# Patient Record
Sex: Female | Born: 1975 | Race: White | Hispanic: No | Marital: Single | Smoking: Former smoker
Health system: Southern US, Community
[De-identification: ages and names within clinical notes are randomized; demographics above are authoritative.]

## PROBLEM LIST (undated history)

## (undated) DIAGNOSIS — K219 Gastro-esophageal reflux disease without esophagitis: Secondary | ICD-10-CM

## (undated) DIAGNOSIS — L409 Psoriasis, unspecified: Secondary | ICD-10-CM

## (undated) DIAGNOSIS — T7840XA Allergy, unspecified, initial encounter: Secondary | ICD-10-CM

## (undated) DIAGNOSIS — L709 Acne, unspecified: Secondary | ICD-10-CM

## (undated) HISTORY — DX: Acne, unspecified: L70.9

## (undated) HISTORY — DX: Gastro-esophageal reflux disease without esophagitis: K21.9

## (undated) HISTORY — DX: Psoriasis, unspecified: L40.9

## (undated) HISTORY — DX: Allergy, unspecified, initial encounter: T78.40XA

---

## 2006-03-17 ENCOUNTER — Ambulatory Visit: Payer: Self-pay | Admitting: Family Medicine

## 2006-05-11 ENCOUNTER — Other Ambulatory Visit: Admission: RE | Admit: 2006-05-11 | Discharge: 2006-05-11 | Payer: Self-pay | Admitting: Family Medicine

## 2006-05-11 ENCOUNTER — Encounter: Payer: Self-pay | Admitting: Family Medicine

## 2006-05-11 ENCOUNTER — Ambulatory Visit: Payer: Self-pay | Admitting: Family Medicine

## 2006-07-27 ENCOUNTER — Ambulatory Visit: Payer: Self-pay | Admitting: Family Medicine

## 2007-02-07 ENCOUNTER — Ambulatory Visit: Payer: Self-pay | Admitting: Family Medicine

## 2007-04-13 ENCOUNTER — Ambulatory Visit: Payer: Self-pay | Admitting: Family Medicine

## 2007-06-25 ENCOUNTER — Telehealth: Payer: Self-pay | Admitting: *Deleted

## 2007-07-11 ENCOUNTER — Encounter: Payer: Self-pay | Admitting: Family Medicine

## 2007-08-28 DIAGNOSIS — K219 Gastro-esophageal reflux disease without esophagitis: Secondary | ICD-10-CM | POA: Insufficient documentation

## 2007-08-28 DIAGNOSIS — J45909 Unspecified asthma, uncomplicated: Secondary | ICD-10-CM | POA: Insufficient documentation

## 2007-12-13 ENCOUNTER — Encounter: Payer: Self-pay | Admitting: Family Medicine

## 2008-02-13 ENCOUNTER — Ambulatory Visit: Payer: Self-pay | Admitting: Family Medicine

## 2008-02-13 LAB — CONVERTED CEMR LAB
Glucose, Urine, Semiquant: NEGATIVE
Protein, U semiquant: NEGATIVE
Specific Gravity, Urine: 1.015
Urobilinogen, UA: 0.2
pH: 5.5

## 2008-02-15 LAB — CONVERTED CEMR LAB
ALT: 12 units/L (ref 0–35)
Bilirubin, Direct: 0.1 mg/dL (ref 0.0–0.3)
Calcium: 9 mg/dL (ref 8.4–10.5)
Cholesterol: 144 mg/dL (ref 0–200)
Eosinophils Absolute: 0.1 10*3/uL (ref 0.0–0.6)
Eosinophils Relative: 2 % (ref 0.0–5.0)
GFR calc Af Amer: 127 mL/min
GFR calc non Af Amer: 105 mL/min
Glucose, Bld: 98 mg/dL (ref 70–99)
HDL: 50.6 mg/dL (ref >39.0)
Lymphocytes Relative: 43.6 % (ref 12.0–46.0)
MCV: 88.8 fL (ref 78.0–100.0)
Neutro Abs: 1.8 10*3/uL (ref 1.4–7.7)
Platelets: 215 10*3/uL (ref 150–400)
Sodium: 139 meq/L (ref 135–145)
TSH: 3.18 microintl units/mL (ref 0.35–5.50)
Triglycerides: 94 mg/dL (ref 0–149)
WBC: 3.9 10*3/uL — ABNORMAL LOW (ref 4.5–10.5)

## 2008-02-20 ENCOUNTER — Ambulatory Visit: Payer: Self-pay | Admitting: Family Medicine

## 2008-02-20 ENCOUNTER — Encounter: Payer: Self-pay | Admitting: Family Medicine

## 2008-02-20 ENCOUNTER — Other Ambulatory Visit: Admission: RE | Admit: 2008-02-20 | Discharge: 2008-02-20 | Payer: Self-pay | Admitting: Family Medicine

## 2008-06-25 ENCOUNTER — Ambulatory Visit: Payer: Self-pay | Admitting: Family Medicine

## 2008-09-02 ENCOUNTER — Ambulatory Visit: Payer: Self-pay | Admitting: Family Medicine

## 2008-11-05 ENCOUNTER — Telehealth: Payer: Self-pay | Admitting: *Deleted

## 2008-11-05 ENCOUNTER — Ambulatory Visit: Payer: Self-pay | Admitting: Family Medicine

## 2008-11-05 DIAGNOSIS — N390 Urinary tract infection, site not specified: Secondary | ICD-10-CM | POA: Insufficient documentation

## 2008-11-05 LAB — CONVERTED CEMR LAB
Bilirubin Urine: NEGATIVE
Ketones, urine, test strip: NEGATIVE
Specific Gravity, Urine: 1.015
pH: 8

## 2008-12-26 ENCOUNTER — Ambulatory Visit: Payer: Self-pay | Admitting: Family Medicine

## 2009-02-04 ENCOUNTER — Telehealth: Payer: Self-pay | Admitting: Family Medicine

## 2009-03-18 ENCOUNTER — Ambulatory Visit: Payer: Self-pay | Admitting: Family Medicine

## 2009-03-18 LAB — CONVERTED CEMR LAB
Bilirubin Urine: NEGATIVE
Glucose, Urine, Semiquant: NEGATIVE
pH: 7

## 2009-03-19 LAB — CONVERTED CEMR LAB
ALT: 19 units/L (ref 0–35)
Albumin: 3.7 g/dL (ref 3.5–5.2)
BUN: 6 mg/dL (ref 6–23)
Chloride: 107 meq/L (ref 96–112)
Cholesterol: 154 mg/dL (ref 0–200)
Eosinophils Relative: 2.9 % (ref 0.0–5.0)
Glucose, Bld: 83 mg/dL (ref 70–99)
HCT: 38.6 % (ref 36.0–46.0)
Hemoglobin: 13.3 g/dL (ref 12.0–15.0)
Lymphs Abs: 1.6 10*3/uL (ref 0.7–4.0)
MCV: 90.9 fL (ref 78.0–100.0)
Monocytes Absolute: 0.2 10*3/uL (ref 0.1–1.0)
Neutro Abs: 1.8 10*3/uL (ref 1.4–7.7)
Platelets: 180 10*3/uL (ref 150.0–400.0)
Potassium: 3.9 meq/L (ref 3.5–5.1)
TSH: 3.49 microintl units/mL (ref 0.35–5.50)
Total Bilirubin: 0.6 mg/dL (ref 0.3–1.2)
Total Protein: 6.3 g/dL (ref 6.0–8.3)
WBC: 3.7 10*3/uL — ABNORMAL LOW (ref 4.5–10.5)

## 2009-03-25 ENCOUNTER — Ambulatory Visit: Payer: Self-pay | Admitting: Family Medicine

## 2009-03-25 ENCOUNTER — Encounter: Payer: Self-pay | Admitting: Family Medicine

## 2009-03-25 ENCOUNTER — Other Ambulatory Visit: Admission: RE | Admit: 2009-03-25 | Discharge: 2009-03-25 | Payer: Self-pay | Admitting: Family Medicine

## 2009-03-27 ENCOUNTER — Encounter: Payer: Self-pay | Admitting: Family Medicine

## 2009-12-14 ENCOUNTER — Telehealth: Payer: Self-pay | Admitting: Family Medicine

## 2009-12-15 ENCOUNTER — Ambulatory Visit: Payer: Self-pay | Admitting: Family Medicine

## 2009-12-30 ENCOUNTER — Ambulatory Visit: Payer: Self-pay | Admitting: Family Medicine

## 2010-04-14 ENCOUNTER — Ambulatory Visit: Payer: Self-pay | Admitting: Family Medicine

## 2010-04-14 LAB — CONVERTED CEMR LAB
Blood in Urine, dipstick: NEGATIVE
Glucose, Urine, Semiquant: NEGATIVE
Nitrite: NEGATIVE
Specific Gravity, Urine: 1.02
WBC Urine, dipstick: NEGATIVE
pH: 6

## 2010-04-15 ENCOUNTER — Telehealth: Payer: Self-pay | Admitting: Family Medicine

## 2010-04-15 LAB — CONVERTED CEMR LAB
ALT: 13 units/L (ref 0–35)
Alkaline Phosphatase: 61 units/L (ref 39–117)
Basophils Absolute: 0 10*3/uL (ref 0.0–0.1)
Basophils Relative: 0.3 % (ref 0.0–3.0)
Bilirubin, Direct: 0.1 mg/dL (ref 0.0–0.3)
CO2: 26 meq/L (ref 19–32)
Chloride: 105 meq/L (ref 96–112)
Eosinophils Absolute: 0.1 10*3/uL (ref 0.0–0.7)
Glucose, Bld: 77 mg/dL (ref 70–99)
Lymphocytes Relative: 28.5 % (ref 12.0–46.0)
MCHC: 35.5 g/dL (ref 30.0–36.0)
MCV: 88.2 fL (ref 78.0–100.0)
Monocytes Absolute: 0.2 10*3/uL (ref 0.1–1.0)
Neutro Abs: 2.7 10*3/uL (ref 1.4–7.7)
Neutrophils Relative %: 64.9 % (ref 43.0–77.0)
Potassium: 4.3 meq/L (ref 3.5–5.1)
RBC: 4.27 M/uL (ref 3.87–5.11)
RDW: 12.9 % (ref 11.5–14.6)
Sodium: 139 meq/L (ref 135–145)
Total Bilirubin: 0.5 mg/dL (ref 0.3–1.2)
Total CHOL/HDL Ratio: 2
Total Protein: 6.3 g/dL (ref 6.0–8.3)

## 2010-04-21 ENCOUNTER — Ambulatory Visit: Payer: Self-pay | Admitting: Family Medicine

## 2010-04-21 ENCOUNTER — Other Ambulatory Visit: Admission: RE | Admit: 2010-04-21 | Discharge: 2010-04-21 | Payer: Self-pay | Admitting: Family Medicine

## 2010-12-28 NOTE — Progress Notes (Signed)
Summary: sda slot  Phone Note Call from Patient Call back at [phone]   Caller: Patient Call For: Nelwyn Salisbury MD Summary of Call: pt has sinus inf can I use sda for tomorrow Initial call taken by: Heron Sabins,  December 14, 2009 12:54 PM  Follow-up for Phone Call        yes Follow-up by: Nelwyn Salisbury MD,  December 14, 2009 1:48 PM  Additional Follow-up for Phone Call Additional follow up Details #1::        SDA 4.15PM 12-15-2008 Additional Follow-up by: Heron Sabins,  December 14, 2009 2:51 PM

## 2010-12-28 NOTE — Assessment & Plan Note (Signed)
Summary: COUGH, CONGESTION, FEVER // RS   Vital Signs:  Patient profile:   35 year old female Weight:      156 pounds BMI:     24.52 Temp:     98.2 degrees F oral BP sitting:   114 / 82  (left arm) Cuff size:   regular  Vitals Entered By: Alfred Levins, CMA (December 30, 2009 10:54 AM) CC: congestion, cough, st, runny nose x4 days   History of Present Illness: She was here last month for a sinusitis, and she seemed to respond well to a Zpack. She felt fine for about 2 weeks, then 3 days ago she became ill again. Now she has stuffy head, HA, ST, and is coughing up yellow sputum. No fever.    Current Medications (verified): 1)  Desoximetasone 0.05 %  Crea (Desoximetasone) .... Once Daily 2)  Ortho Tri-Cyclen (28) 0.035 Mg Tabs (Norgestimate-Ethinyl Estradiol) .Marland Kitchen.. 1 By Mouth Once Daily 3)  Proventil Hfa 108 (90 Base) Mcg/act Aers (Albuterol Sulfate) .... 2 Puffs Q 4 Hours As Needed 4)  Claritin-D 24 Hour 10-240 Mg Xr24h-Tab (Loratadine-Pseudoephedrine) .... Once Daily 5)  Minocycline Hcl 100 Mg Caps (Minocycline Hcl) .... Two Times A Day  Allergies (verified): 1)  ! * Anesthesia 2)  ! Truman Hayward  Past History:  Past Medical History: Reviewed history from 03/25/2009 and no changes required. 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    Physical Exam  General:  Well-developed,well-nourished,in no acute distress; alert,appropriate and  cooperative throughout examination Head:  Normocephalic and atraumatic without obvious abnormalities. No apparent alopecia or balding. Eyes:  No corneal or conjunctival inflammation noted. EOMI. Perrla. Funduscopic exam benign, without hemorrhages, exudates or papilledema. Vision grossly normal. Ears:  External ear exam shows no significant lesions or deformities.  Otoscopic examination reveals clear canals, tympanic membranes are intact bilaterally without bulging, retraction, inflammation or discharge. Hearing is grossly normal bilaterally. Nose:  External nasal examination shows no deformity or inflammation. Nasal mucosa are pink and moist without lesions or exudates. Mouth:  Oral mucosa and oropharynx without lesions or exudates.  Teeth in good repair. Neck:  No deformities, masses, or tenderness noted. Lungs:  Normal respiratory effort, chest expands symmetrically. Lungs are clear to auscultation, no crackles or wheezes.   Impression & Recommendations:  Problem # 1:  ACUTE SINUSITIS, UNSPECIFIED (ICD-461.9)  The following medications were removed from the medication list:    Bactrim 400-80 Mg Tabs (Sulfamethoxazole-trimethoprim) ..... Once daily as needed for acne Her updated medication list for this problem includes:    Claritin-d 24 Hour 10-240 Mg Xr24h-tab (Loratadine-pseudoephedrine) ..... Once daily    Minocycline Hcl 100 Mg Caps (Minocycline hcl) .Marland Kitchen..Marland Kitchen Two times a day as needed    Augmentin 875-125 Mg Tabs (Amoxicillin-pot clavulanate) .Marland Kitchen..Marland Kitchen Two times a day    Hydromet 5-1.5 Mg/1ml Syrp (Hydrocodone-homatropine) .Marland Kitchen... 1 tsp q 4 hours as needed cough  Complete Medication List: 1)  Desoximetasone 0.05 % Crea (Desoximetasone) .... Once daily 2)  Ortho Tri-cyclen (28) 0.035 Mg Tabs (Norgestimate-ethinyl estradiol) .Marland Kitchen.. 1 by mouth once daily 3)  Proventil Hfa 108 (90 Base)  Mcg/act Aers (Albuterol sulfate) .... 2 puffs q 4 hours as needed 4)  Claritin-d 24 Hour 10-240 Mg Xr24h-tab  (Loratadine-pseudoephedrine) .... Once daily 5)  Minocycline Hcl 100 Mg Caps (Minocycline hcl) .... Two times a day as needed 6)  Augmentin 875-125 Mg Tabs (Amoxicillin-pot clavulanate) .... Two times a day 7)  Hydromet 5-1.5 Mg/24ml Syrp (Hydrocodone-homatropine) .Marland Kitchen.. 1 tsp q 4 hours as needed cough  Patient Instructions: 1)  Out of work today.  2)  Please schedule a follow-up appointment as needed .  Prescriptions: HYDROMET 5-1.5 MG/5ML SYRP (HYDROCODONE-HOMATROPINE) 1 tsp q 4 hours as needed cough  #240 x 0   Entered and Authorized by:   Nelwyn Salisbury MD   Signed by:   Nelwyn Salisbury MD on 12/30/2009   Method used:   Print then Give to Patient   RxID:   [id number] AUGMENTIN 875-125 MG TABS (AMOXICILLIN-POT CLAVULANATE) two times a day  #20 x 0   Entered and Authorized by:   Nelwyn Salisbury MD   Signed by:   Nelwyn Salisbury MD on 12/30/2009   Method used:   Print then Give to Patient   RxID:   [id number]

## 2010-12-28 NOTE — Assessment & Plan Note (Signed)
Summary: cpx/pap/njr   Vital Signs:  Patient profile:   35 year old female Weight:      162 pounds BMI:     25.46 BP sitting:   104 / 72  (left arm) Cuff size:   regular  Vitals Entered By: Raechel Ache, RN (Apr 21, 2010 1:27 PM) CC: CPX, labs done.   History of Present Illness: 35 yr old female for a cpx. She feels great and has no concerns. Her asthma is stable, and she has used her inhaler only  twice in the past year.   Allergies: 1)  ! * Anesthesia 2)  ! Truman Hayward  Past History:  Past Medical History: Allergies Psoriasis, sees [employer] at Dr. Scharlene Gloss office Asthma GERD acne  Past Surgical History: Reviewed history from 08/28/2007 and no changes required. Denies surgical history  Family History: Reviewed history from 08/28/2007 and no changes required. Family History of Alcoholism/Addiction Family History of Arthritis Family History Diabetes 1st degree relative Family History High cholesterol Family History Hypertension Family History Psychiatric care Family History of Ulcers Family History of Cardiovascular disorder  Social History: Reviewed history from 08/28/2007 and no changes required. Occupation: Single Former Smoker Alcohol use-yes Drug use-no Regular exercise-no  Review of Systems  The patient denies anorexia, fever, weight loss, weight gain, vision loss, decreased hearing, hoarseness, chest pain, syncope, dyspnea on exertion, peripheral edema, prolonged cough, headaches, hemoptysis, abdominal pain, melena, hematochezia, severe indigestion/heartburn, hematuria, incontinence, genital sores, muscle weakness, suspicious skin lesions, transient blindness, difficulty walking, depression, unusual weight change, abnormal bleeding, enlarged lymph nodes, angioedema, breast masses, and testicular masses.    Physical Exam  General:  Well-developed,well-nourished,in no acute distress; alert,appropriate and cooperative throughout examination Head:   Normocephalic and atraumatic without obvious abnormalities. No apparent alopecia or balding. Eyes:  No corneal or conjunctival inflammation noted. EOMI. Perrla. Funduscopic exam benign, without hemorrhages, exudates or papilledema. Vision grossly normal. Ears:  External ear exam shows no significant lesions or deformities.  Otoscopic examination reveals clear canals, tympanic membranes are intact bilaterally without bulging, retraction, inflammation or discharge. Hearing is grossly normal bilaterally. Nose:  External nasal examination shows no deformity or inflammation. Nasal mucosa are pink and moist without lesions or exudates. Mouth:  Oral mucosa and oropharynx without lesions or exudates.  Teeth in good repair. Neck:  No deformities, masses, or tenderness noted. Chest Wall:  No deformities, masses, or tenderness noted. Breasts:  No mass, nodules, thickening, tenderness, bulging, retraction, inflamation, nipple discharge or skin changes noted.   Lungs:  Normal respiratory effort, chest expands symmetrically. Lungs are clear to auscultation, no crackles or wheezes. Heart:  Normal rate and regular rhythm. S1 and S2 normal without gallop, murmur, click, rub or other extra sounds. Abdomen:  Bowel sounds positive,abdomen soft and non-tender without masses, organomegaly or hernias noted. Genitalia:  Pelvic Exam:        External: normal female genitalia without lesions or masses        Vagina: normal without lesions or masses        Cervix: normal without lesions or masses        Adnexa: normal bimanual exam without masses or fullness        Uterus: normal by palpation        Pap smear: performed Msk:  No deformity or scoliosis noted of thoracic or lumbar spine.   Pulses:  R and L carotid,radial,femoral,dorsalis pedis and posterior tibial pulses are full and equal bilaterally Extremities:  No clubbing, cyanosis, edema, or  deformity noted with normal full range of motion of all joints.   Neurologic:   No cranial nerve deficits noted. Station and gait are normal. Plantar reflexes are down-going bilaterally. DTRs are symmetrical throughout. Sensory, motor and coordinative functions appear intact. Skin:  Intact without suspicious lesions or rashes Cervical Nodes:  No lymphadenopathy noted Axillary Nodes:  No palpable lymphadenopathy Inguinal Nodes:  No significant adenopathy Psych:  Cognition and judgment appear intact. Alert and cooperative with normal attention span and concentration. No apparent delusions, illusions, hallucinations   Impression & Recommendations:  Problem # 1:  PHYSICAL EXAMINATION (ICD-V70.0)  Complete Medication List: 1)  Desoximetasone 0.05 % Crea (Desoximetasone) .... Once daily 2)  Ortho Tri-cyclen (28) 0.035 Mg Tabs (Norgestimate-ethinyl estradiol) .Marland Kitchen.. 1 by mouth once daily 3)  Proventil Hfa 108 (90 Base) Mcg/act Aers (Albuterol sulfate) .... 2 puffs q 4 hours as needed 4)  Claritin-d 24 Hour 10-240 Mg Xr24h-tab (Loratadine-pseudoephedrine) .... Once daily 5)  Minocycline Hcl 100 Mg Caps (Minocycline hcl) .... Two times a day as needed  Patient Instructions: 1)  Please schedule a follow-up appointment in 1 year.  Prescriptions: PROVENTIL HFA 108 (90 BASE) MCG/ACT AERS (ALBUTEROL SULFATE) 2 puffs q 4 hours as needed  #3 x 3   Entered and Authorized by:   Nelwyn Salisbury MD   Signed by:   Nelwyn Salisbury MD on 04/21/2010   Method used:   Print then Give to Patient   RxID:   [id number] ORTHO TRI-CYCLEN (28) 0.035 MG TABS (NORGESTIMATE-ETHINYL ESTRADIOL) 1 by mouth once daily  #90 x 3   Entered and Authorized by:   Nelwyn Salisbury MD   Signed by:   Nelwyn Salisbury MD on 04/21/2010   Method used:   Print then Give to Patient   RxID:   [id number]

## 2010-12-28 NOTE — Progress Notes (Signed)
Summary: rtc  Phone Note Call from Patient Call back at [phone] ask for Mella   Caller: Patient Call For: Nelwyn Salisbury MD Summary of Call: pt is returning judi call. Initial call taken by: Heron Sabins,  Apr 15, 2010 9:03 AM  Follow-up for Phone Call        lab report given Follow-up by: Raechel Ache, RN,  Apr 15, 2010 9:15 AM

## 2010-12-28 NOTE — Assessment & Plan Note (Signed)
Summary: ?SINUS INF/OK PER DOC/NJR   Vital Signs:  Patient profile:   35 year old female Weight:      156 pounds Temp:     98.7 degrees F oral BP sitting:   122 / 82  (left arm) Cuff size:   regular  Vitals Entered By: Alfred Levins, CMA (December 15, 2009 4:08 PM) CC: sinus pressure x3 wks   History of Present Illness: Here with 3 weeks of sinus pressure, HA, PND, ST, and a dry cough. No fever.   Current Medications (verified): 1)  Clarinex Reditabs 5 Mg  Tbdp (Desloratadine) .Marland Kitchen.. 1 By Mouth Once Daily 2)  Desoximetasone 0.05 %  Crea (Desoximetasone) .... Once Daily 3)  Ortho Tri-Cyclen (28) 0.035 Mg Tabs (Norgestimate-Ethinyl Estradiol) .Marland Kitchen.. 1 By Mouth Once Daily 4)  Bactrim 400-80 Mg Tabs (Sulfamethoxazole-Trimethoprim) .... Once Daily As Needed For Acne 5)  Proventil Hfa 108 (90 Base) Mcg/act Aers (Albuterol Sulfate) .... 2 Puffs Q 4 Hours As Needed  Allergies (verified): 1)  ! * Anesthesia 2)  ! Truman Hayward  Past History:  Past Medical History: Reviewed history from 03/25/2009 and no changes required. 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    Physical Exam  General:  Well-developed,well-nourished,in no acute distress; alert,appropriate and cooperative throughout examination Head:  Normocephalic and atraumatic without obvious abnormalities. No apparent alopecia or balding. Eyes:  No corneal or conjunctival inflammation noted. EOMI.  Perrla. Funduscopic exam benign, without hemorrhages, exudates or papilledema. Vision grossly normal. Ears:  External ear exam shows no significant lesions or deformities.  Otoscopic examination reveals clear canals, tympanic membranes are intact bilaterally without bulging, retraction, inflammation or discharge. Hearing is grossly normal bilaterally. Nose:  External nasal examination shows no deformity or inflammation. Nasal mucosa are pink and moist without lesions or exudates. Mouth:  Oral mucosa and oropharynx without lesions or exudates.  Teeth in good repair. Neck:  No deformities, masses, or tenderness noted. Lungs:  Normal respiratory effort, chest expands symmetrically. Lungs are clear to auscultation, no crackles or wheezes.   Impression & Recommendations:  Problem # 1:  ACUTE SINUSITIS, UNSPECIFIED (ICD-461.9)  Her updated medication list for this problem includes:    Bactrim 400-80 Mg Tabs (Sulfamethoxazole-trimethoprim) ..... Once daily as needed for acne    Claritin-d 24 Hour 10-240 Mg Xr24h-tab (Loratadine-pseudoephedrine) ..... Once daily    Minocycline Hcl 100 Mg Caps (Minocycline hcl) .Marland Kitchen..Marland Kitchen Two times a day    Zithromax Z-pak 250 Mg Tabs (Azithromycin) .Marland Kitchen... As directed  Complete Medication List: 1)  Desoximetasone 0.05 % Crea (Desoximetasone) .... Once daily 2)  Ortho Tri-cyclen (28) 0.035 Mg Tabs (Norgestimate-ethinyl estradiol) .Marland Kitchen.. 1 by mouth once daily 3)  Bactrim 400-80 Mg Tabs (Sulfamethoxazole-trimethoprim) .... Once daily as needed for acne 4)  Proventil Hfa 108 (90 Base) Mcg/act Aers (Albuterol sulfate) .... 2 puffs q 4 hours as needed 5)  Claritin-d 24 Hour 10-240 Mg Xr24h-tab (Loratadine-pseudoephedrine) .... Once daily 6)  Minocycline Hcl 100 Mg Caps (Minocycline hcl) .... Two times a day 7)  Zithromax Z-pak 250 Mg Tabs (Azithromycin) .... As directed  Patient Instructions: 1)  Please schedule  a follow-up appointment as needed .  Prescriptions: ZITHROMAX  Z-PAK 250 MG TABS (AZITHROMYCIN) as directed  #1 x 0   Entered and Authorized by:   Nelwyn Salisbury MD   Signed by:   Nelwyn Salisbury MD on 12/15/2009   Method used:   Electronically to        [facility]       [address]       [place], [place]  [address]       Ph: [phone]       Fax: [phone]   RxID:   [id number]

## 2011-06-29 ENCOUNTER — Other Ambulatory Visit (INDEPENDENT_AMBULATORY_CARE_PROVIDER_SITE_OTHER): Payer: BC Managed Care – PPO

## 2011-06-29 DIAGNOSIS — Z0000 Encounter for general adult medical examination without abnormal findings: Secondary | ICD-10-CM

## 2011-06-29 LAB — CBC WITH DIFFERENTIAL/PLATELET
Basophils Absolute: 0 10*3/uL (ref 0.0–0.1)
Lymphocytes Relative: 35.1 % (ref 12.0–46.0)
Lymphs Abs: 1.2 10*3/uL (ref 0.7–4.0)
Monocytes Relative: 6 % (ref 3.0–12.0)
Neutrophils Relative %: 55.9 % (ref 43.0–77.0)
Platelets: 220 10*3/uL (ref 150.0–400.0)
RDW: 12.8 % (ref 11.5–14.6)

## 2011-06-29 LAB — POCT URINALYSIS DIPSTICK
Ketones, UA: NEGATIVE
Leukocytes, UA: NEGATIVE
Protein, UA: NEGATIVE
pH, UA: 6.5

## 2011-06-29 LAB — BASIC METABOLIC PANEL
BUN: 11 mg/dL (ref 6–23)
Calcium: 8.4 mg/dL (ref 8.4–10.5)
Creatinine, Ser: 0.7 mg/dL (ref 0.4–1.2)
GFR: 96.22 mL/min (ref >60.00)
Glucose, Bld: 86 mg/dL (ref 70–99)

## 2011-06-29 LAB — LIPID PANEL
Cholesterol: 138 mg/dL (ref 0–200)
HDL: 65.4 mg/dL (ref >39.00)
Triglycerides: 118 mg/dL (ref 0.0–149.0)
VLDL: 23.6 mg/dL (ref 0.0–40.0)

## 2011-06-30 ENCOUNTER — Encounter: Payer: Self-pay | Admitting: Family Medicine

## 2011-06-30 LAB — HEPATIC FUNCTION PANEL
ALT: 13 U/L (ref 0–35)
Bilirubin, Direct: 0 mg/dL (ref 0.0–0.3)
Total Protein: 6.6 g/dL (ref 6.0–8.3)

## 2011-07-05 ENCOUNTER — Other Ambulatory Visit (HOSPITAL_COMMUNITY)
Admission: RE | Admit: 2011-07-05 | Discharge: 2011-07-05 | Disposition: A | Discharge status: Home / Self Care | Payer: BC Managed Care – PPO | Source: Ambulatory Visit | Attending: Family Medicine | Admitting: Family Medicine

## 2011-07-05 ENCOUNTER — Telehealth: Payer: Self-pay | Admitting: Family Medicine

## 2011-07-05 ENCOUNTER — Encounter: Payer: Self-pay | Admitting: Family Medicine

## 2011-07-05 ENCOUNTER — Ambulatory Visit (INDEPENDENT_AMBULATORY_CARE_PROVIDER_SITE_OTHER): Payer: BC Managed Care – PPO | Admitting: Family Medicine

## 2011-07-05 VITALS — BP 110/72 | HR 104 | Temp 98.6°F | Ht 66.75 in | Wt 174.0 lb

## 2011-07-05 DIAGNOSIS — Z01419 Encounter for gynecological examination (general) (routine) without abnormal findings: Secondary | ICD-10-CM | POA: Insufficient documentation

## 2011-07-05 DIAGNOSIS — Z0000 Encounter for general adult medical examination without abnormal findings: Secondary | ICD-10-CM

## 2011-07-05 MED ORDER — OMEPRAZOLE 40 MG PO CPDR: 40.0000 mg | DELAYED_RELEASE_CAPSULE | Freq: Every day | ORAL | Status: DC

## 2011-07-05 MED ORDER — DROSPIRENONE-ETHINYL ESTRADIOL 3-0.02 MG PO TABS: 1.0000 | ORAL_TABLET | Freq: Every day | ORAL | Status: DC

## 2011-07-05 MED ORDER — ALBUTEROL SULFATE HFA 108 (90 BASE) MCG/ACT IN AERS: 2.0000 | INHALATION_SPRAY | RESPIRATORY_TRACT | Status: DC | PRN

## 2011-07-05 NOTE — Telephone Encounter (Signed)
Pt called and said that her mail order pharmacy has changed from [facility] to [facility] order. Pls send all of pts meds to [facility].

## 2011-07-05 NOTE — Telephone Encounter (Signed)
meds re-ordered to [facility]

## 2011-07-05 NOTE — Progress Notes (Signed)
Spoke with pt and gave results. 

## 2011-07-05 NOTE — Progress Notes (Signed)
Subjective:    Patient ID: Michele Carey, female    DOB: October 24, 1976, 35 y.o.   MRN: [id number]  HPI 35 yr old female for a cpx. She feels fine but has one complaint. She has been on her current BCP for around 5 years, and she thinks it may be suppressing her libido. She wants to switch. Her menses are regular. Her asthma has been stable.    Review of Systems  Constitutional: Negative.  Negative for fever, diaphoresis, activity change, appetite change, fatigue and unexpected weight change.  HENT: Negative.  Negative for hearing loss, ear pain, nosebleeds, congestion, sore throat, trouble swallowing, neck pain, neck stiffness, voice change and tinnitus.   Eyes: Negative.  Negative for photophobia, pain, discharge, redness and visual disturbance.  Respiratory: Negative.  Negative for apnea, cough, choking, chest tightness, shortness of breath, wheezing and stridor.   Cardiovascular: Negative.  Negative for chest pain, palpitations and leg swelling.  Gastrointestinal: Negative.  Negative for nausea, vomiting, abdominal pain, diarrhea, constipation, blood in stool, abdominal distention and rectal pain.  Genitourinary: Negative.  Negative for dysuria, urgency, frequency, hematuria, flank pain, vaginal bleeding, vaginal discharge, enuresis, difficulty urinating, vaginal pain and menstrual problem.  Musculoskeletal: Negative.  Negative for myalgias, back pain, joint swelling, arthralgias and gait problem.  Skin: Negative.  Negative for color change, pallor, rash and wound.  Neurological: Negative.  Negative for dizziness, tremors, seizures, syncope, speech difficulty, weakness, light-headedness, numbness and headaches.  Hematological: Negative.  Negative for adenopathy. Does not bruise/bleed easily.  Psychiatric/Behavioral: Negative.  Negative for hallucinations, behavioral problems, confusion, sleep disturbance, dysphoric mood and agitation. The patient is not nervous/anxious.        Objective:   Physical Exam  Constitutional: She appears well-developed and well-nourished. No distress.  HENT:  Head: Normocephalic and atraumatic.  Right Ear: External ear normal.  Left Ear: External ear normal.  Nose: Nose normal.  Mouth/Throat: Oropharynx is clear and moist. No oropharyngeal exudate.  Eyes: Conjunctivae and EOM are normal. Pupils are equal, round, and reactive to light. Right eye exhibits no discharge. Left eye exhibits no discharge. No scleral icterus.  Neck: Normal range of motion. Neck supple. No JVD present. No thyromegaly present.  Cardiovascular: Normal rate, regular rhythm, normal heart sounds and intact distal pulses.  Exam reveals no gallop and no friction rub.   No murmur heard. Pulmonary/Chest: Effort normal and breath sounds normal. No stridor. No respiratory distress. She has no wheezes. She has no rales. She exhibits no tenderness.  Abdominal: Soft. Normal appearance and bowel sounds are normal. She exhibits no distension, no abdominal bruit, no ascites and no mass. There is no hepatosplenomegaly. There is no tenderness. There is no rigidity, no rebound and no guarding. No hernia.  Genitourinary: Rectum normal, vagina normal and uterus normal. No breast swelling, tenderness, discharge or bleeding. Cervix exhibits no motion tenderness, no discharge and no friability. Right adnexum displays no mass, no tenderness and no fullness. Left adnexum displays no mass, no tenderness and no fullness. No erythema, tenderness or bleeding around the vagina. No vaginal discharge found.  Musculoskeletal: Normal range of motion. She exhibits no edema and no tenderness.  Lymphadenopathy:    She has no cervical adenopathy.  Neurological: She is alert. She has normal reflexes. No cranial nerve deficit. She exhibits normal muscle tone. Coordination normal.  Skin: Skin is warm and dry. No rash noted. She is not diaphoretic. No erythema. No pallor.  Psychiatric: She has a normal mood and affect. Her  behavior is  normal. Judgment and thought content normal.          Assessment & Plan:  Well exam. Switch to Yaz daily.

## 2011-07-07 NOTE — Progress Notes (Signed)
Left voice message with normal results. 

## 2011-07-12 ENCOUNTER — Encounter: Payer: Self-pay | Admitting: Family Medicine

## 2011-07-12 ENCOUNTER — Ambulatory Visit (INDEPENDENT_AMBULATORY_CARE_PROVIDER_SITE_OTHER): Payer: BC Managed Care – PPO | Admitting: Family Medicine

## 2011-07-12 VITALS — BP 122/78 | HR 90 | Temp 98.5°F | Wt 178.0 lb

## 2011-07-12 DIAGNOSIS — B029 Zoster without complications: Secondary | ICD-10-CM

## 2011-07-12 MED ORDER — VALACYCLOVIR HCL 1 G PO TABS: 2000.0000 mg | ORAL_TABLET | Freq: Three times a day (TID) | ORAL | Status: AC

## 2011-07-12 MED ORDER — PREDNISONE (PAK) 10 MG PO TABS: ORAL_TABLET | ORAL | Status: DC

## 2011-07-12 NOTE — Progress Notes (Signed)
  Subjective:    Patient ID: Michele Carey, female    DOB: 06/09/1976, 35 y.o.   MRN: [id number]  HPI Here for 2 days of a rash on the left upper chest and into the left axilla which itches and burns. It started as a red area that then developed blisters.    Review of Systems  Constitutional: Positive for fatigue.  Skin: Positive for rash.       Objective:   Physical Exam  Constitutional: She appears well-developed and well-nourished.  Skin:       The anterior left axilla and left upper chest has  an area of macular erythema with a cluster of vesicles in the middle of it          Assessment & Plan:  Recheck prn.

## 2011-07-13 ENCOUNTER — Telehealth: Payer: Self-pay | Admitting: Family Medicine

## 2011-07-13 NOTE — Telephone Encounter (Signed)
Pt called back to check on status of getting [facility] [address]. Pls call today.

## 2011-07-13 NOTE — Telephone Encounter (Signed)
Was seen yesterday for shingles. Dr fry has asked her if she would need a pain med. She said no. But today, she really needs it. [facility]

## 2011-07-14 MED ORDER — HYDROCODONE-ACETAMINOPHEN 7.5-500 MG PO TABS: 1.0000 | ORAL_TABLET | Freq: Four times a day (QID) | ORAL | Status: AC | PRN

## 2011-07-14 NOTE — Telephone Encounter (Signed)
The forgetfulness os probably from the steroids. This is common and reversible. This will go away when she stops the meds

## 2011-07-14 NOTE — Telephone Encounter (Signed)
Pt has one more question, she is taking prednisone and valtrex and she is starting to become forgetful.  Wants to know if that has anything to do with the 2 meds

## 2011-07-14 NOTE — Telephone Encounter (Signed)
Pt calls again today checking on status on previous messages. She also wanted to let Dr Clent Ridges know that her brother was also diagnosed with shingles on the same day that she was seen here. Please advise.

## 2011-07-14 NOTE — Telephone Encounter (Signed)
Call in Vicodin 7.5/500 to take q 6 hours prn pain, #60 with no rf 

## 2011-07-15 NOTE — Telephone Encounter (Signed)
Spoke with pt and gave below message.

## 2011-11-08 ENCOUNTER — Ambulatory Visit (INDEPENDENT_AMBULATORY_CARE_PROVIDER_SITE_OTHER): Payer: BC Managed Care – PPO | Admitting: Family Medicine

## 2011-11-08 DIAGNOSIS — Z23 Encounter for immunization: Secondary | ICD-10-CM

## 2011-12-14 ENCOUNTER — Ambulatory Visit (INDEPENDENT_AMBULATORY_CARE_PROVIDER_SITE_OTHER): Payer: BC Managed Care – PPO | Admitting: Family Medicine

## 2011-12-14 ENCOUNTER — Encounter: Payer: Self-pay | Admitting: Family Medicine

## 2011-12-14 VITALS — BP 108/76 | HR 96 | Temp 99.0°F | Wt 184.0 lb

## 2011-12-14 DIAGNOSIS — J329 Chronic sinusitis, unspecified: Secondary | ICD-10-CM

## 2011-12-14 MED ORDER — AZITHROMYCIN 250 MG PO TABS: ORAL_TABLET | ORAL | Status: AC

## 2011-12-14 NOTE — Progress Notes (Signed)
  Subjective:    Patient ID: Michele Carey, female    DOB: [birth year], 36 y.o.   MRN: [id number]  HPI Here for 4 weeks of stuffy head, PND, and a dry cough. No fever or HA or ST. No wheezing or SOB. She has taken her antihistamine as usual. She has used her inhaler only twice during this time.    Review of Systems  Constitutional: Negative.   HENT: Positive for congestion, postnasal drip and sinus pressure.   Eyes: Negative.   Respiratory: Positive for cough. Negative for shortness of breath and wheezing.        Objective:   Physical Exam  Constitutional: She appears well-developed and well-nourished.  HENT:  Right Ear: External ear normal.  Left Ear: External ear normal.  Nose: Nose normal.  Mouth/Throat: Oropharynx is clear and moist. No oropharyngeal exudate.  Eyes: Conjunctivae are normal.  Pulmonary/Chest: Effort normal and breath sounds normal.  Lymphadenopathy:    She has no cervical adenopathy.          Assessment & Plan:  Add Mucinex prn

## 2012-05-03 ENCOUNTER — Other Ambulatory Visit: Payer: Self-pay | Admitting: Family Medicine

## 2012-05-03 NOTE — Telephone Encounter (Signed)
Done

## 2012-05-03 NOTE — Telephone Encounter (Signed)
Can we refill this? 

## 2012-05-14 ENCOUNTER — Other Ambulatory Visit: Payer: Self-pay | Admitting: Family Medicine

## 2012-06-26 ENCOUNTER — Encounter: Payer: Self-pay | Admitting: Family Medicine

## 2012-06-26 ENCOUNTER — Ambulatory Visit (INDEPENDENT_AMBULATORY_CARE_PROVIDER_SITE_OTHER): Payer: PRIVATE HEALTH INSURANCE | Admitting: Family Medicine

## 2012-06-26 VITALS — BP 120/86 | HR 89 | Temp 98.4°F | Wt 180.0 lb

## 2012-06-26 DIAGNOSIS — L259 Unspecified contact dermatitis, unspecified cause: Secondary | ICD-10-CM

## 2012-06-26 DIAGNOSIS — L509 Urticaria, unspecified: Secondary | ICD-10-CM

## 2012-06-26 MED ORDER — METHYLPREDNISOLONE ACETATE 40 MG/ML IJ SUSP
120.0000 mg | Freq: Once | INTRAMUSCULAR | Status: AC
  Administered 2012-06-26: 120 mg via INTRAMUSCULAR

## 2012-06-26 NOTE — Progress Notes (Signed)
  Subjective:    Patient ID: Michele Carey, female    DOB: [birth year], 36 y.o.   MRN: [id number]  HPI Here for rashes. She developed some poison ivy on both arms last week after working in the yard. She has been applying calamine lotion. Then yesterday she developed itchy red rashes all over the body, including arms and legs and trunk. She has been taking benadryl on top of her usual Claritin.    Review of Systems  Constitutional: Negative.   Respiratory: Negative.   Cardiovascular: Negative.   Skin: Positive for rash.       Objective:   Physical Exam  Constitutional: She appears well-developed and well-nourished. No distress.  Cardiovascular: Normal rate, regular rhythm, normal heart sounds and intact distal pulses.   Pulmonary/Chest: Effort normal and breath sounds normal. No respiratory distress. She has no wheezes. She has no rales.  Skin:       She has papulovesicular red rashes over both arms, also a fine red maculopapular rash on the trunk          Assessment & Plan:  She has a combination of urticaria and contact dermatitis. Given a steroid shot. Continue antihistamines. Recheck prn

## 2012-09-19 ENCOUNTER — Telehealth: Payer: Self-pay | Admitting: Family Medicine

## 2012-09-19 NOTE — Telephone Encounter (Signed)
Patient called stating that her pharmacy, [facility] has been trying to get a refill of her [place]. Please assist.

## 2012-09-19 NOTE — Telephone Encounter (Signed)
Refill for one year 

## 2012-09-20 MED ORDER — DROSPIRENONE-ETHINYL ESTRADIOL 3-0.02 MG PO TABS: 1.0000 | ORAL_TABLET | Freq: Every day | ORAL | Status: DC

## 2012-09-20 NOTE — Telephone Encounter (Signed)
done

## 2012-10-01 ENCOUNTER — Telehealth: Payer: Self-pay | Admitting: Family Medicine

## 2012-10-01 NOTE — Telephone Encounter (Signed)
Patient's prior auth on the [place] was denied. It turns out that her health insurance does not cover birth control pills as a contraceptive, but only for other dx such as endometriosis, etc. Because she has to pay cash, she would like it if you could switch her to something cheap. [facility] [address]

## 2012-10-01 NOTE — Telephone Encounter (Signed)
She needs to do her own research. Ask her pharmacist what the cheapest BCP for her to buy would be and let me know

## 2012-10-02 ENCOUNTER — Telehealth: Payer: Self-pay | Admitting: Family Medicine

## 2012-10-02 MED ORDER — NORGESTIM-ETH ESTRAD TRIPHASIC 0.18/0.215/0.25 MG-35 MCG PO TABS: 1.0000 | ORAL_TABLET | Freq: Every day | ORAL | Status: DC

## 2012-10-02 NOTE — Telephone Encounter (Signed)
Call in Tri-sprintec for one year

## 2012-10-02 NOTE — Telephone Encounter (Signed)
Sent to the pharmacy by e-scribe to [facility] [address].

## 2012-10-02 NOTE — Telephone Encounter (Signed)
Pt stated she can afford trisprintec ?mg 28 day pack call into [facility] [address]

## 2012-10-02 NOTE — Telephone Encounter (Signed)
I spoke with pt and she will check with the pharmacy.

## 2013-03-04 ENCOUNTER — Encounter: Payer: Self-pay | Admitting: Family Medicine

## 2013-03-04 ENCOUNTER — Ambulatory Visit (INDEPENDENT_AMBULATORY_CARE_PROVIDER_SITE_OTHER): Payer: PRIVATE HEALTH INSURANCE | Admitting: Family Medicine

## 2013-03-04 VITALS — BP 122/80 | HR 91 | Temp 99.3°F | Wt 180.0 lb

## 2013-03-04 DIAGNOSIS — J45909 Unspecified asthma, uncomplicated: Secondary | ICD-10-CM

## 2013-03-04 DIAGNOSIS — Z9109 Other allergy status, other than to drugs and biological substances: Secondary | ICD-10-CM

## 2013-03-04 MED ORDER — MONTELUKAST SODIUM 10 MG PO TABS: 10.0000 mg | ORAL_TABLET | Freq: Every day | ORAL | Status: DC

## 2013-03-04 MED ORDER — METHYLPREDNISOLONE ACETATE 80 MG/ML IJ SUSP
120.0000 mg | Freq: Once | INTRAMUSCULAR | Status: AC
Start: 2013-03-04 — End: 2013-03-04
  Administered 2013-03-04: 120 mg via INTRAMUSCULAR

## 2013-03-04 MED ORDER — HYDROCODONE-HOMATROPINE 5-1.5 MG/5ML PO SYRP: 5.0000 mL | ORAL_SOLUTION | ORAL | Status: DC | PRN

## 2013-03-04 MED ORDER — FLUTICASONE PROPIONATE 50 MCG/ACT NA SUSP: 2.0000 | Freq: Every day | NASAL | Status: DC

## 2013-03-04 MED ORDER — ALBUTEROL SULFATE HFA 108 (90 BASE) MCG/ACT IN AERS: 2.0000 | INHALATION_SPRAY | RESPIRATORY_TRACT | Status: DC | PRN

## 2013-03-04 NOTE — Addendum Note (Signed)
Addended by: Aniceto Boss A on: 03/04/2013 05:05 PM   Modules accepted: Orders

## 2013-03-04 NOTE — Progress Notes (Signed)
  Subjective:    Patient ID: Cathyrn Deas, female    DOB: [birth year], 37 y.o.   MRN: [id number]  HPI Here for 4 days of stuffy head, watery eyes, stuffy nose, PND, hoarseness, and a dry cough. She has had some wheezing and is using her inhaler several times a day. No fever. Using Zyrtec.    Review of Systems  Constitutional: Negative.   HENT: Positive for congestion, rhinorrhea, sneezing, postnasal drip and sinus pressure.   Eyes: Positive for itching. Negative for redness and visual disturbance.  Respiratory: Positive for cough and wheezing. Negative for shortness of breath.   Cardiovascular: Negative.        Objective:   Physical Exam  Constitutional: She appears well-developed and well-nourished.  HENT:  Right Ear: External ear normal.  Left Ear: External ear normal.  Nose: Nose normal.  Mouth/Throat: Oropharynx is clear and moist. No oropharyngeal exudate.  Eyes: Conjunctivae are normal.  Neck: Neck supple.  Pulmonary/Chest: Effort normal and breath sounds normal.  Lymphadenopathy:    She has no cervical adenopathy.          Assessment & Plan:  All these are symptoms of allergies. Given a steroid shot, add Singulair and Flonase daily.

## 2013-03-22 ENCOUNTER — Telehealth: Payer: Self-pay | Admitting: Family Medicine

## 2013-03-22 NOTE — Telephone Encounter (Signed)
Per Dr. Clent Ridges, have pt d/c Singulair.  Pt aware and verbalized understanding. Pt will call the office if hives persist or worsen or if they go away and she needs something in the place of the Singulair

## 2013-03-22 NOTE — Telephone Encounter (Signed)
Patient Information:  Caller Name: Deb  Phone: [phone]  Patient: Michele Carey, Michele Carey  Gender: Female  DOB: 12/01/1975  Age: 37 Years  PCP: Gershon Crane [facility])  Pregnant: No  Office Follow Up:  Does the office need to follow up with this patient?: Yes  Instructions For The Office: Requesting callback- has hives on one arm and elbow- concerned about reaction to Singulair   Symptoms  Reason For Call & Symptoms: Dillon states she was seen in office on 03/04/13 due to allergies. Received steroid injection and was ordered Singulair, Flonase, Hydromet,Albuterol inhaler, Prednisone Injection..  States Singulair has been very  effective.  Had onset of rash red, patches that are  itchy on  one arm and elbow onset 03/14/13. States she read that Singulair can cause hives. Asking should she discontinue the Singulair?  Requesting callback. at [phone]  Reviewed Health History In EMR: Yes  Reviewed Medications In EMR: Yes  Reviewed Allergies In EMR: Yes  Reviewed Surgeries / Procedures: Yes  Date of Onset of Symptoms: 03/15/2013  Treatments Tried: Benadryl spray  Treatments Tried Worked: Yes OB / GYN:  LMP: 03/01/2013  Guideline(s) Used:  Hives  Disposition Per Guideline:   See Today or Tomorrow in Office  Reason For Disposition Reached:   Hives persist > 1 week  Advice Given:  N/A  Patient Refused Recommendation:  Patient Refused Care Advice  Requesting callback regarding hives-

## 2013-07-15 ENCOUNTER — Telehealth: Payer: Self-pay | Admitting: Family Medicine

## 2013-07-15 MED ORDER — OMEPRAZOLE 40 MG PO CPDR: DELAYED_RELEASE_CAPSULE | ORAL | Status: DC

## 2013-07-15 NOTE — Telephone Encounter (Signed)
Pt needs refill on omeprazole (PRILOSEC) 40 MG capsule [facility]  Please send to [facility] on [address].

## 2013-08-06 ENCOUNTER — Other Ambulatory Visit (INDEPENDENT_AMBULATORY_CARE_PROVIDER_SITE_OTHER): Payer: PRIVATE HEALTH INSURANCE

## 2013-08-06 DIAGNOSIS — Z0000 Encounter for general adult medical examination without abnormal findings: Secondary | ICD-10-CM

## 2013-08-06 LAB — BASIC METABOLIC PANEL
BUN: 10 mg/dL (ref 6–23)
CO2: 24 mEq/L (ref 19–32)
Calcium: 8.5 mg/dL (ref 8.4–10.5)
Creatinine, Ser: 0.7 mg/dL (ref 0.4–1.2)
GFR: 108.42 mL/min (ref >60.00)
Glucose, Bld: 93 mg/dL (ref 70–99)

## 2013-08-06 LAB — CBC WITH DIFFERENTIAL/PLATELET
Basophils Absolute: 0 10*3/uL (ref 0.0–0.1)
Eosinophils Absolute: 0.1 10*3/uL (ref 0.0–0.7)
Lymphocytes Relative: 25.1 % (ref 12.0–46.0)
MCHC: 34.4 g/dL (ref 30.0–36.0)
Monocytes Relative: 4.7 % (ref 3.0–12.0)
Neutro Abs: 3.6 10*3/uL (ref 1.4–7.7)
Neutrophils Relative %: 68.3 % (ref 43.0–77.0)
Platelets: 258 10*3/uL (ref 150.0–400.0)
RDW: 12.5 % (ref 11.5–14.6)

## 2013-08-06 LAB — LIPID PANEL
Cholesterol: 148 mg/dL (ref 0–200)
HDL: 63 mg/dL (ref >39.00)
LDL Cholesterol: 62 mg/dL (ref 0–99)
Triglycerides: 113 mg/dL (ref 0.0–149.0)

## 2013-08-06 LAB — HEPATIC FUNCTION PANEL
AST: 17 U/L (ref 0–37)
Albumin: 3.5 g/dL (ref 3.5–5.2)
Alkaline Phosphatase: 73 U/L (ref 39–117)
Bilirubin, Direct: 0.1 mg/dL (ref 0.0–0.3)
Total Bilirubin: 0.5 mg/dL (ref 0.3–1.2)

## 2013-08-06 LAB — POCT URINALYSIS DIPSTICK
Bilirubin, UA: NEGATIVE
Glucose, UA: NEGATIVE
Spec Grav, UA: 1.01
Urobilinogen, UA: 0.2

## 2013-08-14 ENCOUNTER — Encounter: Payer: Self-pay | Admitting: Family Medicine

## 2013-08-14 ENCOUNTER — Encounter: Payer: PRIVATE HEALTH INSURANCE | Admitting: Family Medicine

## 2013-08-14 ENCOUNTER — Ambulatory Visit (INDEPENDENT_AMBULATORY_CARE_PROVIDER_SITE_OTHER): Payer: PRIVATE HEALTH INSURANCE | Admitting: Family Medicine

## 2013-08-14 VITALS — BP 120/82 | HR 85 | Temp 98.4°F | Ht 67.0 in | Wt 177.0 lb

## 2013-08-14 DIAGNOSIS — Z23 Encounter for immunization: Secondary | ICD-10-CM

## 2013-08-14 DIAGNOSIS — Z0000 Encounter for general adult medical examination without abnormal findings: Secondary | ICD-10-CM

## 2013-08-14 MED ORDER — NORGESTIM-ETH ESTRAD TRIPHASIC 0.18/0.215/0.25 MG-35 MCG PO TABS: 1.0000 | ORAL_TABLET | Freq: Every day | ORAL | Status: DC

## 2013-08-14 MED ORDER — DOXYCYCLINE HYCLATE 100 MG PO CAPS: 100.0000 mg | ORAL_CAPSULE | Freq: Two times a day (BID) | ORAL | Status: AC

## 2013-08-14 MED ORDER — OMEPRAZOLE 40 MG PO CPDR: DELAYED_RELEASE_CAPSULE | ORAL | Status: DC

## 2013-08-14 NOTE — Progress Notes (Signed)
  Subjective:    Patient ID: Michele Carey, female    DOB: 1976-09-22, 37 y.o.   MRN: [id number]  HPI 37 yr old female for a cpx. She feels well. She had been prescribed Doxycycline by her Dermatologist in the past for acne, and she asks if I could do that. She also asks about some bruising on the insteps of both feet. She started running for exercise about a month ago, and these areas are mildly painful. She wears an old pair of running shoes.    Review of Systems  Constitutional: Negative.   HENT: Negative.   Eyes: Negative.   Respiratory: Negative.   Cardiovascular: Negative.   Gastrointestinal: Negative.   Genitourinary: Negative for dysuria, urgency, frequency, hematuria, flank pain, decreased urine volume, enuresis, difficulty urinating, pelvic pain and dyspareunia.  Musculoskeletal: Negative.   Skin: Negative.   Neurological: Negative.   Psychiatric/Behavioral: Negative.        Objective:   Physical Exam  Constitutional: She is oriented to person, place, and time. She appears well-developed and well-nourished. No distress.  HENT:  Head: Normocephalic and atraumatic.  Right Ear: External ear normal.  Left Ear: External ear normal.  Nose: Nose normal.  Mouth/Throat: Oropharynx is clear and moist. No oropharyngeal exudate.  Eyes: Conjunctivae and EOM are normal. Pupils are equal, round, and reactive to light. No scleral icterus.  Neck: Normal range of motion. Neck supple. No JVD present. No thyromegaly present.  Cardiovascular: Normal rate, regular rhythm, normal heart sounds and intact distal pulses.  Exam reveals no gallop and no friction rub.   No murmur heard. Pulmonary/Chest: Effort normal and breath sounds normal. No respiratory distress. She has no wheezes. She has no rales. She exhibits no tenderness.  Abdominal: Soft. Bowel sounds are normal. She exhibits no distension and no mass. There is no tenderness. There is no rebound and no guarding.  Musculoskeletal: Normal  range of motion. She exhibits no edema and no tenderness.  Mild bruising and tenderness along both insteps  Lymphadenopathy:    She has no cervical adenopathy.  Neurological: She is alert and oriented to person, place, and time. She has normal reflexes. No cranial nerve deficit. She exhibits normal muscle tone. Coordination normal.  Skin: Skin is warm and dry. No rash noted. No erythema.  Mild papular acne on the face  Psychiatric: She has a normal mood and affect. Her behavior is normal. Judgment and thought content normal.          Assessment & Plan:  Well exam. Start on Doxycycline for the acne. I suggested she purchase a new pair of running shoes at a specialty store where she can be fitted properly.

## 2014-03-04 ENCOUNTER — Other Ambulatory Visit: Payer: Self-pay | Admitting: Family Medicine

## 2014-04-08 ENCOUNTER — Other Ambulatory Visit: Payer: Self-pay | Admitting: Family Medicine

## 2014-09-01 ENCOUNTER — Other Ambulatory Visit: Payer: Self-pay | Admitting: Family Medicine

## 2014-10-04 ENCOUNTER — Other Ambulatory Visit: Payer: Self-pay | Admitting: Family Medicine

## 2014-10-27 ENCOUNTER — Other Ambulatory Visit (INDEPENDENT_AMBULATORY_CARE_PROVIDER_SITE_OTHER): Payer: PRIVATE HEALTH INSURANCE

## 2014-10-27 DIAGNOSIS — Z0000 Encounter for general adult medical examination without abnormal findings: Secondary | ICD-10-CM

## 2014-10-27 LAB — HEPATIC FUNCTION PANEL
ALK PHOS: 88 U/L (ref 39–117)
ALT: 14 U/L (ref 0–35)
AST: 18 U/L (ref 0–37)
Albumin: 3.8 g/dL (ref 3.5–5.2)
BILIRUBIN TOTAL: 0.4 mg/dL (ref 0.2–1.2)
Bilirubin, Direct: 0 mg/dL (ref 0.0–0.3)
Total Protein: 6.5 g/dL (ref 6.0–8.3)

## 2014-10-27 LAB — BASIC METABOLIC PANEL
BUN: 11 mg/dL (ref 6–23)
CALCIUM: 8.4 mg/dL (ref 8.4–10.5)
CO2: 24 mEq/L (ref 19–32)
Chloride: 102 mEq/L (ref 96–112)
Creatinine, Ser: 0.6 mg/dL (ref 0.4–1.2)
GFR: 122.53 mL/min (ref >60.00)
Glucose, Bld: 106 mg/dL — ABNORMAL HIGH (ref 70–99)
POTASSIUM: 4 meq/L (ref 3.5–5.1)
SODIUM: 132 meq/L — AB (ref 135–145)

## 2014-10-27 LAB — LIPID PANEL
Cholesterol: 171 mg/dL (ref 0–200)
HDL: 84.3 mg/dL (ref >39.00)
LDL Cholesterol: 73 mg/dL (ref 0–99)
NONHDL: 86.7
Total CHOL/HDL Ratio: 2
Triglycerides: 71 mg/dL (ref 0.0–149.0)
VLDL: 14.2 mg/dL (ref 0.0–40.0)

## 2014-10-27 LAB — CBC WITH DIFFERENTIAL/PLATELET
Basophils Absolute: 0 10*3/uL (ref 0.0–0.1)
Basophils Relative: 0.3 % (ref 0.0–3.0)
EOS PCT: 2.3 % (ref 0.0–5.0)
Eosinophils Absolute: 0.1 10*3/uL (ref 0.0–0.7)
HEMATOCRIT: 40.3 % (ref 36.0–46.0)
HEMOGLOBIN: 13.4 g/dL (ref 12.0–15.0)
LYMPHS ABS: 1.5 10*3/uL (ref 0.7–4.0)
LYMPHS PCT: 32.6 % (ref 12.0–46.0)
MCHC: 33.1 g/dL (ref 30.0–36.0)
MCV: 87.4 fl (ref 78.0–100.0)
MONOS PCT: 5.9 % (ref 3.0–12.0)
Monocytes Absolute: 0.3 10*3/uL (ref 0.1–1.0)
Neutro Abs: 2.7 10*3/uL (ref 1.4–7.7)
Neutrophils Relative %: 58.9 % (ref 43.0–77.0)
PLATELETS: 229 10*3/uL (ref 150.0–400.0)
RBC: 4.61 Mil/uL (ref 3.87–5.11)
RDW: 13.3 % (ref 11.5–15.5)
WBC: 4.7 10*3/uL (ref 4.0–10.5)

## 2014-10-27 LAB — POCT URINALYSIS DIPSTICK
BILIRUBIN UA: NEGATIVE
Glucose, UA: NEGATIVE
KETONES UA: NEGATIVE
LEUKOCYTES UA: NEGATIVE
Nitrite, UA: NEGATIVE
PH UA: 7
Protein, UA: NEGATIVE
RBC UA: NEGATIVE
Spec Grav, UA: <=1.005
Urobilinogen, UA: 0.2

## 2014-10-27 LAB — TSH: TSH: 2.88 u[IU]/mL (ref 0.35–4.50)

## 2014-10-31 ENCOUNTER — Telehealth: Payer: Self-pay | Admitting: Family Medicine

## 2014-10-31 NOTE — Telephone Encounter (Signed)
Yes please

## 2014-10-31 NOTE — Telephone Encounter (Signed)
Pt had to cancelled appt that was sch for Monday due to starting her period. Can I create 30 min slot before end of yr?

## 2014-11-03 ENCOUNTER — Encounter: Payer: PRIVATE HEALTH INSURANCE | Admitting: Family Medicine

## 2014-11-03 NOTE — Telephone Encounter (Signed)
Pt has been scheduled.  °

## 2014-11-07 ENCOUNTER — Ambulatory Visit (INDEPENDENT_AMBULATORY_CARE_PROVIDER_SITE_OTHER): Payer: PRIVATE HEALTH INSURANCE | Admitting: Family Medicine

## 2014-11-07 ENCOUNTER — Encounter: Payer: Self-pay | Admitting: Family Medicine

## 2014-11-07 ENCOUNTER — Other Ambulatory Visit (HOSPITAL_COMMUNITY)
Admission: RE | Admit: 2014-11-07 | Discharge: 2014-11-07 | Disposition: A | Discharge status: Home / Self Care | Payer: PRIVATE HEALTH INSURANCE | Source: Ambulatory Visit | Attending: Family Medicine | Admitting: Family Medicine

## 2014-11-07 VITALS — BP 106/73 | HR 73 | Temp 98.9°F | Ht 67.0 in | Wt 164.0 lb

## 2014-11-07 DIAGNOSIS — Z0000 Encounter for general adult medical examination without abnormal findings: Secondary | ICD-10-CM

## 2014-11-07 DIAGNOSIS — Z01419 Encounter for gynecological examination (general) (routine) without abnormal findings: Secondary | ICD-10-CM | POA: Insufficient documentation

## 2014-11-07 MED ORDER — OMEPRAZOLE 40 MG PO CPDR: 40.0000 mg | DELAYED_RELEASE_CAPSULE | Freq: Every day | ORAL | Status: DC

## 2014-11-07 MED ORDER — ETONOGESTREL-ETHINYL ESTRADIOL 0.12-0.015 MG/24HR VA RING: VAGINAL_RING | VAGINAL | Status: DC

## 2014-11-07 MED ORDER — DOXYCYCLINE HYCLATE 100 MG PO CAPS: 100.0000 mg | ORAL_CAPSULE | Freq: Two times a day (BID) | ORAL | Status: AC

## 2014-11-07 MED ORDER — ALBUTEROL SULFATE HFA 108 (90 BASE) MCG/ACT IN AERS: 2.0000 | INHALATION_SPRAY | RESPIRATORY_TRACT | Status: DC | PRN

## 2014-11-07 NOTE — Addendum Note (Signed)
Addended by: Aniceto BossNIMMONS, Arelene Moroni A on: 11/07/2014 04:47 PM   Modules accepted: Orders

## 2014-11-07 NOTE — Progress Notes (Signed)
Subjective:    Patient ID: Michele AbuCarrie Carey, female    DOB: 08/17/1976, 38 y.o.   MRN: [id number]  HPI 38 yr old female for a cpx. She feels fine but she wants to switch from oral BCP to the Nuvaring. Her acne is acting up so she wants to resume taking Doxycycline.    Review of Systems  Constitutional: Negative.  Negative for fever, diaphoresis, activity change, appetite change, fatigue and unexpected weight change.  HENT: Negative.  Negative for congestion, ear pain, hearing loss, nosebleeds, sore throat, tinnitus, trouble swallowing and voice change.   Eyes: Negative.  Negative for photophobia, pain, discharge, redness and visual disturbance.  Respiratory: Negative.  Negative for apnea, cough, choking, chest tightness, shortness of breath, wheezing and stridor.   Cardiovascular: Negative.  Negative for chest pain, palpitations and leg swelling.  Gastrointestinal: Negative.  Negative for nausea, vomiting, abdominal pain, diarrhea, constipation, blood in stool, abdominal distention and rectal pain.  Genitourinary: Negative.  Negative for dysuria, urgency, frequency, hematuria, flank pain, vaginal bleeding, vaginal discharge, enuresis, difficulty urinating, vaginal pain and menstrual problem.  Musculoskeletal: Negative.  Negative for myalgias, back pain, joint swelling, arthralgias, gait problem, neck pain and neck stiffness.  Skin: Negative.  Negative for color change, pallor, rash and wound.  Neurological: Negative.  Negative for dizziness, tremors, seizures, syncope, speech difficulty, weakness, light-headedness, numbness and headaches.  Hematological: Negative for adenopathy. Does not bruise/bleed easily.  Psychiatric/Behavioral: Negative.  Negative for hallucinations, behavioral problems, confusion, sleep disturbance, dysphoric mood and agitation. The patient is not nervous/anxious.        Objective:   Physical Exam  Constitutional: She appears well-developed and well-nourished. No  distress.  HENT:  Head: Normocephalic and atraumatic.  Right Ear: External ear normal.  Left Ear: External ear normal.  Nose: Nose normal.  Mouth/Throat: Oropharynx is clear and moist. No oropharyngeal exudate.  Eyes: Conjunctivae and EOM are normal. Pupils are equal, round, and reactive to light. Right eye exhibits no discharge. Left eye exhibits no discharge. No scleral icterus.  Neck: Normal range of motion. Neck supple. No JVD present. No thyromegaly present.  Cardiovascular: Normal rate, regular rhythm, normal heart sounds and intact distal pulses.  Exam reveals no gallop and no friction rub.   No murmur heard. Pulmonary/Chest: Effort normal and breath sounds normal. No stridor. No respiratory distress. She has no wheezes. She has no rales. She exhibits no tenderness.  Abdominal: Soft. Normal appearance and bowel sounds are normal. She exhibits no distension, no abdominal bruit, no ascites and no mass. There is no hepatosplenomegaly. There is no tenderness. There is no rigidity, no rebound and no guarding. No hernia.  Genitourinary: Rectum normal, vagina normal and uterus normal. No breast swelling, tenderness, discharge or bleeding. Cervix exhibits no motion tenderness, no discharge and no friability. Right adnexum displays no mass, no tenderness and no fullness. Left adnexum displays no mass, no tenderness and no fullness. No erythema, tenderness or bleeding in the vagina. No vaginal discharge found.  Musculoskeletal: Normal range of motion. She exhibits no edema or tenderness.  Lymphadenopathy:    She has no cervical adenopathy.  Neurological: She is alert. She has normal reflexes. No cranial nerve deficit. She exhibits normal muscle tone. Coordination normal.  Skin: Skin is warm and dry. No rash noted. She is not diaphoretic. No erythema. No pallor.  Psychiatric: She has a normal mood and affect. Her behavior is normal. Judgment and thought content normal.          Assessment &  Plan:  Well exam. Advised her to get a baseline mammogram.

## 2014-11-07 NOTE — Progress Notes (Signed)
Pre visit review using our clinic review tool, if applicable. No additional management support is needed unless otherwise documented below in the visit note. 

## 2014-11-11 LAB — CYTOLOGY - PAP

## 2014-11-12 ENCOUNTER — Telehealth: Payer: Self-pay | Admitting: Family Medicine

## 2014-11-12 NOTE — Telephone Encounter (Signed)
Then ask Lyla SonCarrie what she wants to do

## 2014-11-12 NOTE — Telephone Encounter (Signed)
[employer] denied Nuva Ring.  Patient's plan provides coverage of the prescribed medication when it is being used for a medically necessary non-contraceptive purpose listed in a pharmaceutical compendium.

## 2014-11-12 NOTE — Telephone Encounter (Signed)
I spoke with pt and she will resubmit this request in January, her insurance will cover then.

## 2015-04-02 ENCOUNTER — Other Ambulatory Visit: Payer: Self-pay | Admitting: Family Medicine

## 2015-04-26 ENCOUNTER — Other Ambulatory Visit: Payer: Self-pay | Admitting: Family Medicine

## 2015-07-29 ENCOUNTER — Other Ambulatory Visit: Payer: Self-pay | Admitting: Family Medicine

## 2015-08-28 ENCOUNTER — Other Ambulatory Visit: Payer: Self-pay | Admitting: Family Medicine

## 2015-10-26 ENCOUNTER — Other Ambulatory Visit: Payer: Self-pay | Admitting: Family Medicine

## 2015-10-28 ENCOUNTER — Other Ambulatory Visit: Payer: Self-pay

## 2015-10-28 MED ORDER — OMEPRAZOLE 40 MG PO CPDR: 40.0000 mg | DELAYED_RELEASE_CAPSULE | Freq: Every day | ORAL | Status: DC

## 2015-10-28 NOTE — Telephone Encounter (Signed)
Rx request for omeprazole dr 40 mg capsule-Take 1 capsule by mouth one time daily #30.  Pharmacy:  [facility] [address]   Rx sent to pharmacy for 90 day supply; advised time for physical exam.

## 2016-02-14 ENCOUNTER — Other Ambulatory Visit: Payer: Self-pay | Admitting: Family Medicine

## 2016-02-25 ENCOUNTER — Other Ambulatory Visit (INDEPENDENT_AMBULATORY_CARE_PROVIDER_SITE_OTHER): Payer: PRIVATE HEALTH INSURANCE

## 2016-02-25 DIAGNOSIS — Z0000 Encounter for general adult medical examination without abnormal findings: Secondary | ICD-10-CM | POA: Diagnosis not present

## 2016-02-25 LAB — POC URINALSYSI DIPSTICK (AUTOMATED)
BILIRUBIN UA: NEGATIVE
GLUCOSE UA: NEGATIVE
Ketones, UA: NEGATIVE
Leukocytes, UA: NEGATIVE
Nitrite, UA: NEGATIVE
Protein, UA: NEGATIVE
RBC UA: NEGATIVE
SPEC GRAV UA: 1.01
UROBILINOGEN UA: 0.2
pH, UA: 7

## 2016-02-25 LAB — BASIC METABOLIC PANEL
BUN: 10 mg/dL (ref 6–23)
CALCIUM: 8.8 mg/dL (ref 8.4–10.5)
CHLORIDE: 104 meq/L (ref 96–112)
CO2: 27 meq/L (ref 19–32)
Creatinine, Ser: 0.68 mg/dL (ref 0.40–1.20)
GFR: 103.25 mL/min (ref >60.00)
GLUCOSE: 91 mg/dL (ref 70–99)
POTASSIUM: 3.9 meq/L (ref 3.5–5.1)
SODIUM: 138 meq/L (ref 135–145)

## 2016-02-25 LAB — CBC WITH DIFFERENTIAL/PLATELET
BASOS ABS: 0 10*3/uL (ref 0.0–0.1)
Basophils Relative: 0.5 % (ref 0.0–3.0)
EOS PCT: 1.8 % (ref 0.0–5.0)
Eosinophils Absolute: 0.1 10*3/uL (ref 0.0–0.7)
HEMATOCRIT: 38.7 % (ref 36.0–46.0)
Hemoglobin: 13.3 g/dL (ref 12.0–15.0)
LYMPHS PCT: 33.3 % (ref 12.0–46.0)
Lymphs Abs: 1.1 10*3/uL (ref 0.7–4.0)
MCHC: 34.4 g/dL (ref 30.0–36.0)
MCV: 86.4 fl (ref 78.0–100.0)
MONOS PCT: 5.7 % (ref 3.0–12.0)
Monocytes Absolute: 0.2 10*3/uL (ref 0.1–1.0)
NEUTROS ABS: 1.9 10*3/uL (ref 1.4–7.7)
Neutrophils Relative %: 58.7 % (ref 43.0–77.0)
PLATELETS: 226 10*3/uL (ref 150.0–400.0)
RBC: 4.48 Mil/uL (ref 3.87–5.11)
RDW: 12.7 % (ref 11.5–15.5)
WBC: 3.3 10*3/uL — ABNORMAL LOW (ref 4.0–10.5)

## 2016-02-25 LAB — HEPATIC FUNCTION PANEL
ALBUMIN: 4.1 g/dL (ref 3.5–5.2)
ALK PHOS: 78 U/L (ref 39–117)
ALT: 13 U/L (ref 0–35)
AST: 14 U/L (ref 0–37)
Bilirubin, Direct: 0.1 mg/dL (ref 0.0–0.3)
TOTAL PROTEIN: 6.3 g/dL (ref 6.0–8.3)
Total Bilirubin: 0.3 mg/dL (ref 0.2–1.2)

## 2016-02-25 LAB — LIPID PANEL
CHOL/HDL RATIO: 2
Cholesterol: 140 mg/dL (ref 0–200)
HDL: 72.4 mg/dL (ref >39.00)
LDL CALC: 59 mg/dL (ref 0–99)
NonHDL: 67.99
Triglycerides: 46 mg/dL (ref 0.0–149.0)
VLDL: 9.2 mg/dL (ref 0.0–40.0)

## 2016-02-25 LAB — TSH: TSH: 1.83 u[IU]/mL (ref 0.35–4.50)

## 2016-03-01 ENCOUNTER — Encounter: Payer: Self-pay | Admitting: Family Medicine

## 2016-03-01 ENCOUNTER — Ambulatory Visit (INDEPENDENT_AMBULATORY_CARE_PROVIDER_SITE_OTHER): Payer: PRIVATE HEALTH INSURANCE | Admitting: Family Medicine

## 2016-03-01 ENCOUNTER — Other Ambulatory Visit (HOSPITAL_COMMUNITY)
Admission: RE | Admit: 2016-03-01 | Discharge: 2016-03-01 | Disposition: A | Discharge status: Home / Self Care | Payer: PRIVATE HEALTH INSURANCE | Source: Ambulatory Visit | Attending: Family Medicine | Admitting: Family Medicine

## 2016-03-01 VITALS — BP 120/86 | HR 101 | Temp 99.1°F | Ht 67.0 in | Wt 174.0 lb

## 2016-03-01 DIAGNOSIS — Z0000 Encounter for general adult medical examination without abnormal findings: Secondary | ICD-10-CM | POA: Diagnosis not present

## 2016-03-01 DIAGNOSIS — Z01419 Encounter for gynecological examination (general) (routine) without abnormal findings: Secondary | ICD-10-CM | POA: Diagnosis present

## 2016-03-01 NOTE — Addendum Note (Signed)
Addended by: Aniceto BossNIMMONS, Raiyan Dalesandro A on: 03/01/2016 03:06 PM   Modules accepted: Orders

## 2016-03-01 NOTE — Progress Notes (Signed)
Subjective:    Patient ID: Michele Carey, female    DOB: [birth year], 40 y.o.   MRN: [id number]  HPI 40 yr old female for a cps. She feels well although the springtime allergies bother her sinuses. Her asthma has been well controlled and she uses her albuterol only once every week or two.    Review of Systems  Constitutional: Negative.  Negative for fever, diaphoresis, activity change, appetite change, fatigue and unexpected weight change.  HENT: Negative.  Negative for congestion, ear pain, hearing loss, nosebleeds, sore throat, tinnitus, trouble swallowing and voice change.   Eyes: Negative.  Negative for photophobia, pain, discharge, redness and visual disturbance.  Respiratory: Negative.  Negative for apnea, cough, choking, chest tightness, shortness of breath, wheezing and stridor.   Cardiovascular: Negative.  Negative for chest pain, palpitations and leg swelling.  Gastrointestinal: Negative.  Negative for nausea, vomiting, abdominal pain, diarrhea, constipation, blood in stool, abdominal distention and rectal pain.  Genitourinary: Negative.  Negative for dysuria, urgency, frequency, hematuria, flank pain, vaginal bleeding, vaginal discharge, enuresis, difficulty urinating, vaginal pain and menstrual problem.  Musculoskeletal: Negative.  Negative for myalgias, back pain, joint swelling, arthralgias, gait problem, neck pain and neck stiffness.  Skin: Negative.  Negative for color change, pallor, rash and wound.  Neurological: Negative.  Negative for dizziness, tremors, seizures, syncope, speech difficulty, weakness, light-headedness, numbness and headaches.  Hematological: Negative for adenopathy. Does not bruise/bleed easily.  Psychiatric/Behavioral: Negative.  Negative for hallucinations, behavioral problems, confusion, sleep disturbance, dysphoric mood and agitation. The patient is not nervous/anxious.        Objective:   Physical Exam  Constitutional: She appears well-developed  and well-nourished. No distress.  HENT:  Head: Normocephalic and atraumatic.  Right Ear: External ear normal.  Left Ear: External ear normal.  Nose: Nose normal.  Mouth/Throat: Oropharynx is clear and moist. No oropharyngeal exudate.  Eyes: Conjunctivae and EOM are normal. Pupils are equal, round, and reactive to light. Right eye exhibits no discharge. Left eye exhibits no discharge. No scleral icterus.  Neck: Normal range of motion. Neck supple. No JVD present. No thyromegaly present.  Cardiovascular: Normal rate, regular rhythm, normal heart sounds and intact distal pulses.  Exam reveals no gallop and no friction rub.   No murmur heard. Pulmonary/Chest: Effort normal and breath sounds normal. No stridor. No respiratory distress. She has no wheezes. She has no rales. She exhibits no tenderness.  Abdominal: Soft. Normal appearance and bowel sounds are normal. She exhibits no distension, no abdominal bruit, no ascites and no mass. There is no hepatosplenomegaly. There is no tenderness. There is no rigidity, no rebound and no guarding. No hernia.  Genitourinary: Rectum normal, vagina normal and uterus normal. No breast swelling, tenderness, discharge or bleeding. Cervix exhibits no motion tenderness, no discharge and no friability. Right adnexum displays no mass, no tenderness and no fullness. Left adnexum displays no mass, no tenderness and no fullness. No erythema, tenderness or bleeding in the vagina. No vaginal discharge found.  Musculoskeletal: Normal range of motion. She exhibits no edema or tenderness.  Lymphadenopathy:    She has no cervical adenopathy.  Neurological: She is alert. She has normal reflexes. No cranial nerve deficit. She exhibits normal muscle tone. Coordination normal.  Skin: Skin is warm and dry. No rash noted. She is not diaphoretic. No erythema. No pallor.  Psychiatric: She has a normal mood and affect. Her behavior is normal. Judgment and thought content normal.  Assessment & Plan:  Well exam. We discussed diet and exercise.  Nelwyn SalisburyFRY,Valta Dillon A, MD

## 2016-03-01 NOTE — Progress Notes (Signed)
Pre visit review using our clinic review tool, if applicable. No additional management support is needed unless otherwise documented below in the visit note. 

## 2016-03-02 ENCOUNTER — Other Ambulatory Visit: Payer: Self-pay | Admitting: Family Medicine

## 2016-03-02 NOTE — Telephone Encounter (Signed)
Pt had physical yesterday and all her prescriptions were suppose to called in     albuterol (PROVENTIL HFA) 108 (90 BASE) MCG/ACT inhaler ,  fluticasone (FLONASE) 50 MCG/ACT nasal spray , montelukast (SINGULAIR) 10 MG tablet ,  omeprazole (PRILOSEC) 40 MG capsule   [facility] [address]

## 2016-03-03 LAB — CYTOLOGY - PAP

## 2016-03-03 MED ORDER — FLUTICASONE PROPIONATE 50 MCG/ACT NA SUSP: 2.0000 | Freq: Every day | NASAL | Status: DC

## 2016-03-03 MED ORDER — MONTELUKAST SODIUM 10 MG PO TABS: 10.0000 mg | ORAL_TABLET | Freq: Every day | ORAL | Status: DC

## 2016-03-03 MED ORDER — CETIRIZINE HCL 10 MG PO TABS: 10.0000 mg | ORAL_TABLET | Freq: Every day | ORAL | Status: AC

## 2016-03-03 MED ORDER — ALBUTEROL SULFATE HFA 108 (90 BASE) MCG/ACT IN AERS: 2.0000 | INHALATION_SPRAY | RESPIRATORY_TRACT | Status: DC | PRN

## 2016-03-03 MED ORDER — OMEPRAZOLE 40 MG PO CPDR
40.0000 mg | DELAYED_RELEASE_CAPSULE | Freq: Every day | ORAL | Status: DC
Start: 2016-03-03 — End: 2016-06-16

## 2016-03-03 NOTE — Telephone Encounter (Signed)
Rx is been sent  

## 2016-04-08 ENCOUNTER — Other Ambulatory Visit: Payer: Self-pay | Admitting: Family Medicine

## 2016-06-16 ENCOUNTER — Other Ambulatory Visit: Payer: Self-pay | Admitting: Family Medicine

## 2016-06-16 NOTE — Telephone Encounter (Signed)
Refill sent to pharmacy.   

## 2016-07-21 ENCOUNTER — Other Ambulatory Visit: Payer: Self-pay | Admitting: Family Medicine

## 2016-08-15 ENCOUNTER — Other Ambulatory Visit: Payer: Self-pay | Admitting: Family Medicine

## 2016-10-08 ENCOUNTER — Other Ambulatory Visit: Payer: Self-pay | Admitting: Family Medicine

## 2017-02-01 ENCOUNTER — Telehealth: Payer: PRIVATE HEALTH INSURANCE | Admitting: Nurse Practitioner

## 2017-02-01 DIAGNOSIS — J0100 Acute maxillary sinusitis, unspecified: Secondary | ICD-10-CM

## 2017-02-01 MED ORDER — DOXYCYCLINE HYCLATE 100 MG PO TABS: 100.0000 mg | ORAL_TABLET | Freq: Two times a day (BID) | ORAL | 0 refills | Status: DC

## 2017-02-01 NOTE — Progress Notes (Signed)

## 2017-05-04 ENCOUNTER — Other Ambulatory Visit: Payer: Self-pay | Admitting: Family Medicine

## 2017-05-18 ENCOUNTER — Other Ambulatory Visit: Payer: Self-pay | Admitting: Family Medicine

## 2017-07-19 ENCOUNTER — Other Ambulatory Visit: Payer: Self-pay | Admitting: Family Medicine

## 2017-08-16 ENCOUNTER — Other Ambulatory Visit: Payer: Self-pay | Admitting: Family Medicine

## 2017-08-17 ENCOUNTER — Encounter: Payer: Self-pay | Admitting: Family Medicine

## 2017-08-24 ENCOUNTER — Other Ambulatory Visit (HOSPITAL_COMMUNITY)
Admission: RE | Admit: 2017-08-24 | Discharge: 2017-08-24 | Disposition: A | Discharge status: Home / Self Care | Payer: PRIVATE HEALTH INSURANCE | Source: Ambulatory Visit | Attending: Family Medicine | Admitting: Family Medicine

## 2017-08-24 ENCOUNTER — Encounter: Payer: Self-pay | Admitting: Family Medicine

## 2017-08-24 ENCOUNTER — Ambulatory Visit (INDEPENDENT_AMBULATORY_CARE_PROVIDER_SITE_OTHER): Payer: PRIVATE HEALTH INSURANCE | Admitting: Family Medicine

## 2017-08-24 VITALS — BP 108/88 | HR 83 | Temp 98.8°F | Ht 67.0 in | Wt 180.0 lb

## 2017-08-24 DIAGNOSIS — Z23 Encounter for immunization: Secondary | ICD-10-CM

## 2017-08-24 DIAGNOSIS — Z0000 Encounter for general adult medical examination without abnormal findings: Secondary | ICD-10-CM

## 2017-08-24 LAB — POC URINALSYSI DIPSTICK (AUTOMATED)
BILIRUBIN UA: NEGATIVE
Ketones, UA: NEGATIVE
Leukocytes, UA: NEGATIVE
NITRITE UA: NEGATIVE
Protein, UA: NEGATIVE
RBC UA: NEGATIVE
Spec Grav, UA: 1.01 (ref 1.010–1.025)
Urobilinogen, UA: 0.2 E.U./dL
pH, UA: 6 (ref 5.0–8.0)

## 2017-08-24 LAB — HEPATIC FUNCTION PANEL
ALBUMIN: 4.4 g/dL (ref 3.5–5.2)
ALK PHOS: 70 U/L (ref 39–117)
ALT: 11 U/L (ref 0–35)
AST: 12 U/L (ref 0–37)
Bilirubin, Direct: 0.1 mg/dL (ref 0.0–0.3)
Total Bilirubin: 0.4 mg/dL (ref 0.2–1.2)
Total Protein: 6.6 g/dL (ref 6.0–8.3)

## 2017-08-24 LAB — BASIC METABOLIC PANEL
BUN: 10 mg/dL (ref 6–23)
CO2: 27 meq/L (ref 19–32)
Calcium: 9.1 mg/dL (ref 8.4–10.5)
Chloride: 106 mEq/L (ref 96–112)
Creatinine, Ser: 0.69 mg/dL (ref 0.40–1.20)
GFR: 100.72 mL/min (ref >60.00)
GLUCOSE: 107 mg/dL — AB (ref 70–99)
Potassium: 4.4 mEq/L (ref 3.5–5.1)
SODIUM: 138 meq/L (ref 135–145)

## 2017-08-24 LAB — LIPID PANEL
CHOLESTEROL: 139 mg/dL (ref 0–200)
HDL: 70 mg/dL (ref >39.00)
LDL Cholesterol: 59 mg/dL (ref 0–99)
NonHDL: 69.08
Total CHOL/HDL Ratio: 2
Triglycerides: 52 mg/dL (ref 0.0–149.0)
VLDL: 10.4 mg/dL (ref 0.0–40.0)

## 2017-08-24 LAB — CBC WITH DIFFERENTIAL/PLATELET
BASOS ABS: 0 10*3/uL (ref 0.0–0.1)
Basophils Relative: 0.4 % (ref 0.0–3.0)
EOS PCT: 0.6 % (ref 0.0–5.0)
Eosinophils Absolute: 0 10*3/uL (ref 0.0–0.7)
HCT: 39.8 % (ref 36.0–46.0)
HEMOGLOBIN: 13.5 g/dL (ref 12.0–15.0)
LYMPHS ABS: 1.1 10*3/uL (ref 0.7–4.0)
LYMPHS PCT: 23.2 % (ref 12.0–46.0)
MCHC: 33.8 g/dL (ref 30.0–36.0)
MCV: 88.6 fl (ref 78.0–100.0)
MONO ABS: 0.3 10*3/uL (ref 0.1–1.0)
Monocytes Relative: 5.5 % (ref 3.0–12.0)
NEUTROS PCT: 70.3 % (ref 43.0–77.0)
Neutro Abs: 3.4 10*3/uL (ref 1.4–7.7)
Platelets: 218 10*3/uL (ref 150.0–400.0)
RBC: 4.49 Mil/uL (ref 3.87–5.11)
RDW: 13.6 % (ref 11.5–15.5)
WBC: 4.9 10*3/uL (ref 4.0–10.5)

## 2017-08-24 LAB — TSH: TSH: 1.1 u[IU]/mL (ref 0.35–4.50)

## 2017-08-24 MED ORDER — ALBUTEROL SULFATE HFA 108 (90 BASE) MCG/ACT IN AERS: 2.0000 | INHALATION_SPRAY | RESPIRATORY_TRACT | 11 refills | Status: DC | PRN

## 2017-08-24 MED ORDER — MONTELUKAST SODIUM 10 MG PO TABS: ORAL_TABLET | ORAL | 11 refills | Status: DC

## 2017-08-24 MED ORDER — FLUTICASONE PROPIONATE 50 MCG/ACT NA SUSP: 2.0000 | Freq: Every day | NASAL | 11 refills | Status: DC

## 2017-08-24 MED ORDER — OMEPRAZOLE 40 MG PO CPDR: DELAYED_RELEASE_CAPSULE | ORAL | 11 refills | Status: DC

## 2017-08-24 NOTE — Progress Notes (Signed)
Subjective:    Patient ID: Michele Carey, female    DOB: August 01, 1976, 41 y.o.   MRN: [id number]  HPI Here for a well exam. She feels well. She has discovered that by decreasing her intake of foods containing gluten she can reduce the frequency of her asthma episodes. Her menses are regular.    Review of Systems  Constitutional: Negative.  Negative for activity change, appetite change, diaphoresis, fatigue, fever and unexpected weight change.  HENT: Negative.  Negative for congestion, ear pain, hearing loss, nosebleeds, sore throat, tinnitus, trouble swallowing and voice change.   Eyes: Negative.  Negative for photophobia, pain, discharge, redness and visual disturbance.  Respiratory: Negative.  Negative for apnea, cough, choking, chest tightness, shortness of breath, wheezing and stridor.   Cardiovascular: Negative.  Negative for chest pain, palpitations and leg swelling.  Gastrointestinal: Negative.  Negative for abdominal distention, abdominal pain, blood in stool, constipation, diarrhea, nausea, rectal pain and vomiting.  Genitourinary: Negative.  Negative for difficulty urinating, dysuria, enuresis, flank pain, frequency, hematuria, menstrual problem, urgency, vaginal bleeding, vaginal discharge and vaginal pain.  Musculoskeletal: Negative.  Negative for arthralgias, back pain, gait problem, joint swelling, myalgias, neck pain and neck stiffness.  Skin: Negative.  Negative for color change, pallor, rash and wound.  Neurological: Negative.  Negative for dizziness, tremors, seizures, syncope, speech difficulty, weakness, light-headedness, numbness and headaches.  Hematological: Negative for adenopathy. Does not bruise/bleed easily.  Psychiatric/Behavioral: Negative.  Negative for agitation, behavioral problems, confusion, dysphoric mood, hallucinations and sleep disturbance. The patient is not nervous/anxious.        Objective:   Physical Exam  Constitutional: She appears well-developed  and well-nourished. No distress.  HENT:  Head: Normocephalic and atraumatic.  Right Ear: External ear normal.  Left Ear: External ear normal.  Nose: Nose normal.  Mouth/Throat: Oropharynx is clear and moist. No oropharyngeal exudate.  Eyes: Pupils are equal, round, and reactive to light. Conjunctivae and EOM are normal. Right eye exhibits no discharge. Left eye exhibits no discharge. No scleral icterus.  Neck: Normal range of motion. Neck supple. No JVD present. No thyromegaly present.  Cardiovascular: Normal rate, regular rhythm, normal heart sounds and intact distal pulses.  Exam reveals no gallop and no friction rub.   No murmur heard. Pulmonary/Chest: Effort normal and breath sounds normal. No stridor. No respiratory distress. She has no wheezes. She has no rales. She exhibits no tenderness.  Abdominal: Soft. Normal appearance and bowel sounds are normal. She exhibits no distension, no abdominal bruit, no ascites and no mass. There is no hepatosplenomegaly. There is no tenderness. There is no rigidity, no rebound and no guarding. No hernia.  Genitourinary: Rectum normal, vagina normal and uterus normal. No breast swelling, tenderness, discharge or bleeding. Cervix exhibits no motion tenderness, no discharge and no friability. Right adnexum displays no mass, no tenderness and no fullness. Left adnexum displays no mass, no tenderness and no fullness. No erythema, tenderness or bleeding in the vagina. No vaginal discharge found.  Musculoskeletal: Normal range of motion. She exhibits no edema or tenderness.  Lymphadenopathy:    She has no cervical adenopathy.  Neurological: She is alert. She has normal reflexes. No cranial nerve deficit. She exhibits normal muscle tone. Coordination normal.  Skin: Skin is warm and dry. No rash noted. She is not diaphoretic. No erythema. No pallor.  Psychiatric: She has a normal mood and affect. Her behavior is normal. Judgment and thought content normal.  Assessment & Plan:  Well exam. We discussed diet and exercise. Get fasting labs today. I suggested she set up a baseline mammogram.  Gershon Crane, MD

## 2017-08-24 NOTE — Patient Instructions (Signed)
WE NOW OFFER   [facility] FAST TRACK!!!  SAME DAY Appointments for ACUTE CARE  Such as: Sprains, Injuries, cuts, abrasions, rashes, muscle pain, joint pain, back pain Colds, flu, sore throats, headache, allergies, cough, fever  Ear pain, sinus and eye infections Abdominal pain, nausea, vomiting, diarrhea, upset stomach Animal/insect bites  3 Easy Ways to Schedule: Walk-In Scheduling Call in scheduling Mychart Sign-up: [email or web address]         

## 2017-08-25 LAB — CYTOLOGY - PAP: Diagnosis: NEGATIVE

## 2017-08-29 ENCOUNTER — Other Ambulatory Visit: Payer: Self-pay | Admitting: Family Medicine

## 2017-08-29 NOTE — Telephone Encounter (Signed)
Request to change from Proventil to Ventolin, due to insurance coverage [facility]

## 2017-08-31 MED ORDER — ALBUTEROL SULFATE HFA 108 (90 BASE) MCG/ACT IN AERS: 2.0000 | INHALATION_SPRAY | RESPIRATORY_TRACT | 11 refills | Status: DC | PRN

## 2017-08-31 NOTE — Telephone Encounter (Signed)
Per Dr fry okay to change. I spoke with pt and [facility]

## 2018-03-08 ENCOUNTER — Telehealth: Payer: PRIVATE HEALTH INSURANCE | Admitting: Family

## 2018-03-08 DIAGNOSIS — J3089 Other allergic rhinitis: Secondary | ICD-10-CM

## 2018-03-08 MED ORDER — BENZONATATE 100 MG PO CAPS: 100.0000 mg | ORAL_CAPSULE | Freq: Three times a day (TID) | ORAL | 0 refills | Status: DC | PRN

## 2018-03-08 NOTE — Progress Notes (Signed)
Thank you for the details you included in the comment boxes. Those details are very helpful in determining the best course of treatment for you and help us to provide the best care.  E visit for Allergic Rhinitis We are sorry that you are not feeling well.  Her is how we plan to help!  Based on what you have shared with me it looks like you have Allergic Rhinitis.  Rhinitis is when a reaction occurs that causes nasal congestion, runny nose, sneezing, and itching.  Most types of rhinitis are caused by an inflammation and are associated with symptoms in the eyes ears or throat. There are several types of rhinitis.  The most common are acute rhinitis, which is usually caused by a viral illness, allergic or seasonal rhinitis, and nonallergic or year-round rhinitis.  Nasal allergies occur certain times of the year.  Allergic rhinitis is caused when allergens in the air trigger the release of histamine in the body.  Histamine causes itching, swelling, and fluid to build up in the fragile linings of the nasal passages, sinuses and eyelids.  An itchy nose and clear discharge are common.  I recommend the following over the counter treatments: Xyzal 5 mg take 1 tablet daily  I also would recommend a nasal spray: Flonase 2 sprays into each nostril once daily  You may also benefit from eye drops such as: Visine 1-2 drops each eye twice daily as needed   I  Know you are already doing some of these. I can also send Tessalon perles to help temporarily with your cough. 100mg , take 1 or 2 every 8 hours as needed for cough.   HOME CARE:   You can use an over-the-counter saline nasal spray as needed  Avoid areas where there is heavy dust, mites, or molds  Stay indoors on windy days during the pollen season  Keep windows closed in home, at least in bedroom; use air conditioner.  Use high-efficiency house air filter  Keep windows closed in car, turn AC on re-circulate  Avoid playing out with dog during  pollen season  GET HELP RIGHT AWAY IF:   If your symptoms do not improve within 10 days  You become short of breath  You develop yellow or green discharge from your nose for over 3 days  You have coughing fits  MAKE SURE YOU:   Understand these instructions  Will watch your condition  Will get help right away if you are not doing well or get worse  Thank you for choosing an e-visit. Your e-visit answers were reviewed by a board certified advanced clinical practitioner to complete your personal care plan. Depending upon the condition, your plan could have included both over the counter or prescription medications. Please review your pharmacy choice. Be sure that the pharmacy you have chosen is open so that you can pick up your prescription now.  If there is a problem you may message your provider in MyChart to have the prescription routed to another pharmacy. Your safety is important to us. If you have drug allergies check your prescription carefully.  For the next 24 hours, you can use MyChart to ask questions about today's visit, request a non-urgent call back, or ask for a work or school excuse from your e-visit provider. You will get an email in the next two days asking about your experience. I hope that your e-visit has been valuable and will speed your recovery.

## 2018-05-14 ENCOUNTER — Ambulatory Visit (INDEPENDENT_AMBULATORY_CARE_PROVIDER_SITE_OTHER): Payer: PRIVATE HEALTH INSURANCE | Admitting: Family Medicine

## 2018-05-14 ENCOUNTER — Encounter: Payer: Self-pay | Admitting: Family Medicine

## 2018-05-14 VITALS — BP 102/70 | HR 89 | Temp 99.1°F | Ht 67.0 in | Wt 181.6 lb

## 2018-05-14 DIAGNOSIS — J4530 Mild persistent asthma, uncomplicated: Secondary | ICD-10-CM | POA: Diagnosis not present

## 2018-05-14 DIAGNOSIS — J3089 Other allergic rhinitis: Secondary | ICD-10-CM | POA: Diagnosis not present

## 2018-05-14 MED ORDER — BENZONATATE 100 MG PO CAPS: 100.0000 mg | ORAL_CAPSULE | Freq: Three times a day (TID) | ORAL | 5 refills | Status: DC | PRN

## 2018-05-14 MED ORDER — METHYLPREDNISOLONE ACETATE 80 MG/ML IJ SUSP
160.0000 mg | Freq: Once | INTRAMUSCULAR | Status: AC
  Administered 2018-05-14: 160 mg via INTRAMUSCULAR

## 2018-05-14 NOTE — Addendum Note (Signed)
Addended by: Gracelyn NurseBLACKWELL, Ernestina Joe P on: 05/14/2018 11:38 AM   Modules accepted: Orders

## 2018-05-14 NOTE — Progress Notes (Signed)
   Subjective:    Patient ID: Michele AbuCarrie Carey, female    DOB: 12/13/1975, 42 y.o.   MRN: [id number]  HPI Here for her allergies. She had had a rough couple of weeks with sneezing, PND, and hard dry coughing. Using all her routine medications. She does plan to see [facility] in January for possible allergy shots, but she needs to wait until next year for insurance reasons.    Review of Systems  Constitutional: Negative.   HENT: Positive for postnasal drip and sneezing. Negative for congestion, facial swelling, sinus pressure, sinus pain and sore throat.   Eyes: Positive for itching. Negative for redness.  Respiratory: Positive for cough and wheezing.        Objective:   Physical Exam  Constitutional: She appears well-developed and well-nourished. No distress.  HENT:  Right Ear: External ear normal.  Left Ear: External ear normal.  Nose: Nose normal.  Mouth/Throat: Oropharynx is clear and moist.  Eyes: Conjunctivae are normal.  Neck: No thyromegaly present.  Pulmonary/Chest: Effort normal and breath sounds normal. No stridor. No respiratory distress. She has no wheezes. She has no rales.  Lymphadenopathy:    She has no cervical adenopathy.          Assessment & Plan:  Allergies. Given a steroid shot. Refilled Benzonatate.  Gershon CraneStephen Fry, MD

## 2018-05-18 ENCOUNTER — Encounter: Payer: Self-pay | Admitting: Family Medicine

## 2018-05-18 NOTE — Telephone Encounter (Signed)
The letter is ready to pick up  °

## 2018-08-14 ENCOUNTER — Other Ambulatory Visit: Payer: Self-pay | Admitting: Family Medicine

## 2018-09-01 ENCOUNTER — Other Ambulatory Visit: Payer: Self-pay | Admitting: Family Medicine

## 2018-09-15 ENCOUNTER — Other Ambulatory Visit: Payer: Self-pay | Admitting: Family Medicine

## 2018-10-19 ENCOUNTER — Other Ambulatory Visit: Payer: Self-pay | Admitting: Family Medicine

## 2018-10-19 DIAGNOSIS — Z1231 Encounter for screening mammogram for malignant neoplasm of breast: Secondary | ICD-10-CM

## 2018-10-22 ENCOUNTER — Ambulatory Visit
Admission: RE | Admit: 2018-10-22 | Discharge: 2018-10-22 | Disposition: A | Discharge status: Home / Self Care | Payer: PRIVATE HEALTH INSURANCE | Source: Ambulatory Visit | Attending: Family Medicine | Admitting: Family Medicine

## 2018-10-22 DIAGNOSIS — Z1231 Encounter for screening mammogram for malignant neoplasm of breast: Secondary | ICD-10-CM

## 2019-01-08 ENCOUNTER — Ambulatory Visit (INDEPENDENT_AMBULATORY_CARE_PROVIDER_SITE_OTHER): Payer: PRIVATE HEALTH INSURANCE | Admitting: Family Medicine

## 2019-01-08 ENCOUNTER — Encounter: Payer: Self-pay | Admitting: Family Medicine

## 2019-01-08 VITALS — BP 122/82 | HR 84 | Temp 99.1°F | Wt 193.6 lb

## 2019-01-08 DIAGNOSIS — R059 Cough, unspecified: Secondary | ICD-10-CM

## 2019-01-08 DIAGNOSIS — R05 Cough: Secondary | ICD-10-CM

## 2019-01-08 DIAGNOSIS — R0789 Other chest pain: Secondary | ICD-10-CM

## 2019-01-08 LAB — POC INFLUENZA A&B (BINAX/QUICKVUE)
Influenza A, POC: NEGATIVE
Influenza B, POC: NEGATIVE

## 2019-01-08 NOTE — Progress Notes (Signed)
   Subjective:    Patient ID: Michele Carey, female    DOB: February 08, 1976, 43 y.o.   MRN: [id number]  HPI Here for the onset last night of a dry cough. She had a fit of coughing which then subsided, and when she woke up this morning she had a sharp pain the anterior right lower ribs. This is painful when she deep breathes or bends at the waist. No SOB, no fever. 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    Review of Systems  Constitutional: Negative.   HENT: Negative.   Eyes: Negative.   Respiratory: Positive for cough.   Cardiovascular: Positive for chest pain.       Objective:   Physical Exam Constitutional:      Appearance: Normal appearance.  HENT:     Right Ear: Tympanic membrane and ear canal normal.     Left Ear: Tympanic membrane and ear canal normal.     Nose: Nose normal.     Mouth/Throat:     Pharynx: Oropharynx is clear.  Eyes:     Conjunctiva/sclera: Conjunctivae normal.  Pulmonary:     Effort: Pulmonary effort is normal. No respiratory distress.     Breath sounds: Normal breath sounds. No stridor. No wheezing, rhonchi or rales.     Comments: She is tender over the inferior anterior right ribs Lymphadenopathy:     Cervical: No cervical adenopathy.  Neurological:     Mental Status: She is alert.           Assessment & Plan:  Her chest wall pain is from a muscular strain from coughing. She can use heat and Ibuprofen prn. She tested negative for influenza.  Gershon Crane, MD

## 2019-06-02 ENCOUNTER — Telehealth: Payer: PRIVATE HEALTH INSURANCE | Admitting: Family

## 2019-06-02 DIAGNOSIS — L230 Allergic contact dermatitis due to metals: Secondary | ICD-10-CM

## 2019-06-02 MED ORDER — DOXYCYCLINE HYCLATE 100 MG PO TABS: 200.0000 mg | ORAL_TABLET | Freq: Once | ORAL | 0 refills | Status: AC

## 2019-06-02 MED ORDER — PREDNISONE 10 MG (21) PO TBPK: ORAL_TABLET | ORAL | 0 refills | Status: DC

## 2019-06-02 NOTE — Progress Notes (Signed)
E Visit for Rash  We are sorry that you are not feeling well. Here is how we plan to help!  Based on what you shared with me it looks like you have contact dermatitis.  Contact dermatitis is a skin rash caused by something that touches the skin and causes irritation or inflammation.  Your skin may be red, swollen, dry, cracked, and itch.  The rash should go away in a few days but can last a few weeks.  If you get a rash, it's important to figure out what caused it so the irritant can be avoided in the future. and I have prescribed Prednisone 10 mg daily for 5 days  I do not believe this is a tick bite, but I did send in medication to cover you for one just in case. Doxycycline 200 mg, only 1 dose.    HOME CARE:   Take cool showers and avoid direct sunlight.  Apply cool compress or wet dressings.  Take a bath in an oatmeal bath.  Sprinkle content of one Aveeno packet under running faucet with comfortably warm water.  Bathe for 15-20 minutes, 1-2 times daily.  Pat dry with a towel. Do not rub the rash.  Use hydrocortisone cream.  Take an antihistamine like Benadryl for widespread rashes that itch.  The adult dose of Benadryl is 25-50 mg by mouth 4 times daily.  Caution:  This type of medication may cause sleepiness.  Do not drink alcohol, drive, or operate dangerous machinery while taking antihistamines.  Do not take these medications if you have prostate enlargement.  Read package instructions thoroughly on all medications that you take.  GET HELP RIGHT AWAY IF:   Symptoms don't go away after treatment.  Severe itching that persists.  If you rash spreads or swells.  If you rash begins to smell.  If it blisters and opens or develops a yellow-brown crust.  You develop a fever.  You have a sore throat.  You become short of breath.  MAKE SURE YOU:  Understand these instructions. Will watch your condition. Will get help right away if you are not doing well or get  worse.  Thank you for choosing an e-visit. Your e-visit answers were reviewed by a board certified advanced clinical practitioner to complete your personal care plan. Depending upon the condition, your plan could have included both over the counter or prescription medications. Please review your pharmacy choice. Be sure that the pharmacy you have chosen is open so that you can pick up your prescription now.  If there is a problem you may message your provider in [place] to have the prescription routed to another pharmacy. Your safety is important to [place]. If you have drug allergies check your prescription carefully.  For the next 24 hours, you can use MyChart to ask questions about today's visit, request a non-urgent call back, or ask for a work or school excuse from your e-visit provider. You will get an email in the next two days asking about your experience. I hope that your e-visit has been valuable and will speed your recovery.    Greater than 5 minutes, yet less than 10 minutes of time have been spent researching, coordinating, and implementing care for this patient today.  Thank you for the details you included in the comment boxes. Those details are very helpful in determining the best course of treatment for you and help [place] to provide the best care.

## 2019-07-01 ENCOUNTER — Other Ambulatory Visit: Payer: Self-pay | Admitting: Family Medicine

## 2019-08-09 ENCOUNTER — Other Ambulatory Visit: Payer: Self-pay | Admitting: Family Medicine

## 2019-08-14 ENCOUNTER — Other Ambulatory Visit: Payer: Self-pay | Admitting: Family Medicine

## 2019-08-14 ENCOUNTER — Other Ambulatory Visit: Payer: Self-pay

## 2019-08-14 DIAGNOSIS — Z20822 Contact with and (suspected) exposure to covid-19: Secondary | ICD-10-CM

## 2019-08-14 DIAGNOSIS — J3089 Other allergic rhinitis: Secondary | ICD-10-CM

## 2019-08-15 LAB — NOVEL CORONAVIRUS, NAA: SARS-CoV-2, NAA: NOT DETECTED

## 2019-08-15 NOTE — Telephone Encounter (Signed)
Okay for refill?  Spoke to pt and she stated she would like some more to be called into pharmacy.   Please advise

## 2019-08-16 ENCOUNTER — Other Ambulatory Visit: Payer: Self-pay | Admitting: Family Medicine

## 2019-10-14 ENCOUNTER — Other Ambulatory Visit: Payer: Self-pay | Admitting: Family Medicine

## 2019-11-19 ENCOUNTER — Other Ambulatory Visit: Payer: Self-pay | Admitting: Family Medicine

## 2019-11-19 DIAGNOSIS — Z1231 Encounter for screening mammogram for malignant neoplasm of breast: Secondary | ICD-10-CM

## 2019-11-28 ENCOUNTER — Ambulatory Visit
Admission: RE | Admit: 2019-11-28 | Discharge: 2019-11-28 | Disposition: A | Discharge status: Home / Self Care | Payer: PRIVATE HEALTH INSURANCE | Source: Ambulatory Visit

## 2019-11-28 ENCOUNTER — Other Ambulatory Visit: Payer: Self-pay

## 2019-11-28 DIAGNOSIS — Z1231 Encounter for screening mammogram for malignant neoplasm of breast: Secondary | ICD-10-CM

## 2019-12-18 ENCOUNTER — Ambulatory Visit: Payer: PRIVATE HEALTH INSURANCE | Attending: Internal Medicine

## 2019-12-18 DIAGNOSIS — Z20822 Contact with and (suspected) exposure to covid-19: Secondary | ICD-10-CM

## 2019-12-19 LAB — NOVEL CORONAVIRUS, NAA: SARS-CoV-2, NAA: NOT DETECTED

## 2020-08-02 ENCOUNTER — Other Ambulatory Visit: Payer: Self-pay | Admitting: Family Medicine

## 2020-08-28 ENCOUNTER — Other Ambulatory Visit: Payer: Self-pay | Admitting: Family Medicine

## 2020-08-31 ENCOUNTER — Other Ambulatory Visit: Payer: Self-pay | Admitting: Family Medicine

## 2020-09-18 ENCOUNTER — Other Ambulatory Visit: Payer: Self-pay | Admitting: Family Medicine

## 2020-09-18 MED ORDER — OMEPRAZOLE 40 MG PO CPDR: 40.0000 mg | DELAYED_RELEASE_CAPSULE | Freq: Every day | ORAL | 0 refills | Status: DC

## 2020-10-02 ENCOUNTER — Other Ambulatory Visit: Payer: Self-pay | Admitting: Family Medicine

## 2020-10-21 ENCOUNTER — Ambulatory Visit (INDEPENDENT_AMBULATORY_CARE_PROVIDER_SITE_OTHER): Payer: PRIVATE HEALTH INSURANCE | Admitting: Family Medicine

## 2020-10-21 ENCOUNTER — Encounter: Payer: Self-pay | Admitting: Family Medicine

## 2020-10-21 ENCOUNTER — Other Ambulatory Visit: Payer: Self-pay

## 2020-10-21 VITALS — BP 122/80 | HR 76 | Temp 98.6°F | Ht 68.0 in | Wt 188.6 lb

## 2020-10-21 DIAGNOSIS — Z124 Encounter for screening for malignant neoplasm of cervix: Secondary | ICD-10-CM

## 2020-10-21 DIAGNOSIS — Z0000 Encounter for general adult medical examination without abnormal findings: Secondary | ICD-10-CM | POA: Diagnosis not present

## 2020-10-21 MED ORDER — OMEPRAZOLE 40 MG PO CPDR: 40.0000 mg | DELAYED_RELEASE_CAPSULE | Freq: Every day | ORAL | 11 refills | Status: DC

## 2020-10-21 MED ORDER — FLUTICASONE PROPIONATE 50 MCG/ACT NA SUSP: NASAL | 11 refills | Status: DC

## 2020-10-21 MED ORDER — ALBUTEROL SULFATE HFA 108 (90 BASE) MCG/ACT IN AERS: 2.0000 | INHALATION_SPRAY | RESPIRATORY_TRACT | 11 refills | Status: AC | PRN

## 2020-10-21 NOTE — Progress Notes (Signed)
Subjective:    Patient ID: Michele Carey, female    DOB: 09-07-1976, 44 y.o.   MRN: [id number]  HPI Here for a well exam. She is doing well, although the falling leaves have been flaring her asthma a bit. She is getting allergy shots per Dr. Oceano Callas.    Review of Systems  Constitutional: Negative.  Negative for activity change, appetite change, diaphoresis, fatigue, fever and unexpected weight change.  HENT: Negative.  Negative for congestion, ear pain, hearing loss, nosebleeds, sore throat, tinnitus, trouble swallowing and voice change.   Eyes: Negative.  Negative for photophobia, pain, discharge, redness and visual disturbance.  Respiratory: Negative.  Negative for apnea, cough, choking, chest tightness, shortness of breath, wheezing and stridor.   Cardiovascular: Negative.  Negative for chest pain, palpitations and leg swelling.  Gastrointestinal: Negative.  Negative for abdominal distention, abdominal pain, blood in stool, constipation, diarrhea, nausea, rectal pain and vomiting.  Genitourinary: Negative.  Negative for decreased urine volume, difficulty urinating, dyspareunia, dysuria, enuresis, flank pain, frequency, hematuria, menstrual problem, pelvic pain, urgency, vaginal bleeding, vaginal discharge and vaginal pain.  Musculoskeletal: Negative.  Negative for arthralgias, back pain, gait problem, joint swelling, myalgias, neck pain and neck stiffness.  Skin: Negative.  Negative for color change, pallor, rash and wound.  Neurological: Negative.  Negative for dizziness, tremors, seizures, syncope, speech difficulty, weakness, light-headedness, numbness and headaches.  Hematological: Negative for adenopathy. Does not bruise/bleed easily.  Psychiatric/Behavioral: Negative.  Negative for agitation, behavioral problems, confusion, dysphoric mood, hallucinations and sleep disturbance. The patient is not nervous/anxious.        Objective:   Physical Exam Constitutional:      General: She  is not in acute distress.    Appearance: Normal appearance. She is well-developed. She is not diaphoretic.  HENT:     Head: Normocephalic and atraumatic.     Right Ear: External ear normal.     Left Ear: External ear normal.     Nose: Nose normal.     Mouth/Throat:     Pharynx: No oropharyngeal exudate.  Eyes:     General: No scleral icterus.       Right eye: No discharge.        Left eye: No discharge.     Conjunctiva/sclera: Conjunctivae normal.     Pupils: Pupils are equal, round, and reactive to light.  Neck:     Thyroid: No thyromegaly.     Vascular: No JVD.  Cardiovascular:     Rate and Rhythm: Normal rate and regular rhythm.     Heart sounds: Normal heart sounds. No murmur heard.  No friction rub. No gallop.   Pulmonary:     Effort: Pulmonary effort is normal. No respiratory distress.     Breath sounds: Normal breath sounds. No stridor. No wheezing or rales.  Chest:     Chest wall: No tenderness.  Abdominal:     General: Bowel sounds are normal. There is no distension or abdominal bruit.     Palpations: Abdomen is soft. Abdomen is not rigid. There is no mass.     Tenderness: There is no abdominal tenderness. There is no guarding or rebound.     Hernia: No hernia is present.  Genitourinary:    General: Normal vulva.     Vagina: Normal. No vaginal discharge, erythema, tenderness or bleeding.     Cervix: No cervical motion tenderness, discharge or friability.     Adnexa:        Right: No mass, tenderness  or fullness.         Left: No mass, tenderness or fullness.       Rectum: Normal.  Musculoskeletal:        General: No tenderness. Normal range of motion.     Cervical back: Normal range of motion and neck supple.  Lymphadenopathy:     Cervical: No cervical adenopathy.  Skin:    General: Skin is warm and dry.     Coloration: Skin is not pale.     Findings: No erythema or rash.  Neurological:     Mental Status: She is alert.     Cranial Nerves: No cranial nerve  deficit.     Motor: No abnormal muscle tone.     Coordination: Coordination normal.     Deep Tendon Reflexes: Reflexes are normal and symmetric.  Psychiatric:        Behavior: Behavior normal.        Thought Content: Thought content normal.        Judgment: Judgment normal.           Assessment & Plan:  Well exam. We discussed diet and exercise. Get fasting labs. Gershon Crane, MD

## 2020-10-22 LAB — HEPATIC FUNCTION PANEL
AG Ratio: 1.8 (calc) (ref 1.0–2.5)
ALT: 13 U/L (ref 6–29)
AST: 15 U/L (ref 10–30)
Albumin: 4.4 g/dL (ref 3.6–5.1)
Alkaline phosphatase (APISO): 88 U/L (ref 31–125)
Bilirubin, Direct: 0.1 mg/dL (ref 0.0–0.2)
Globulin: 2.4 g/dL (calc) (ref 1.9–3.7)
Indirect Bilirubin: 0.3 mg/dL (calc) (ref 0.2–1.2)
Total Bilirubin: 0.4 mg/dL (ref 0.2–1.2)
Total Protein: 6.8 g/dL (ref 6.1–8.1)

## 2020-10-22 LAB — CBC WITH DIFFERENTIAL/PLATELET
Absolute Monocytes: 260 cells/uL (ref 200–950)
Basophils Absolute: 22 cells/uL (ref 0–200)
Basophils Relative: 0.5 %
Eosinophils Absolute: 22 cells/uL (ref 15–500)
Eosinophils Relative: 0.5 %
HCT: 39.8 % (ref 35.0–45.0)
Hemoglobin: 12.9 g/dL (ref 11.7–15.5)
Lymphs Abs: 1131 cells/uL (ref 850–3900)
MCH: 27.2 pg (ref 27.0–33.0)
MCHC: 32.4 g/dL (ref 32.0–36.0)
MCV: 83.8 fL (ref 80.0–100.0)
MPV: 9.3 fL (ref 7.5–12.5)
Monocytes Relative: 5.9 %
Neutro Abs: 2966 cells/uL (ref 1500–7800)
Neutrophils Relative %: 67.4 %
Platelets: 278 10*3/uL (ref 140–400)
RBC: 4.75 10*6/uL (ref 3.80–5.10)
RDW: 12.9 % (ref 11.0–15.0)
Total Lymphocyte: 25.7 %
WBC: 4.4 10*3/uL (ref 3.8–10.8)

## 2020-10-22 LAB — LIPID PANEL
Cholesterol: 195 mg/dL (ref <200)
HDL: 98 mg/dL (ref >=50)
LDL Cholesterol (Calc): 82 mg/dL (calc)
Non-HDL Cholesterol (Calc): 97 mg/dL (calc) (ref <130)
Total CHOL/HDL Ratio: 2 (calc) (ref <5.0)
Triglycerides: 73 mg/dL (ref <150)

## 2020-10-22 LAB — BASIC METABOLIC PANEL WITH GFR
BUN: 10 mg/dL (ref 7–25)
CO2: 28 mmol/L (ref 20–32)
Calcium: 9.4 mg/dL (ref 8.6–10.2)
Chloride: 103 mmol/L (ref 98–110)
Creat: 0.7 mg/dL (ref 0.50–1.10)
GFR, Est African American: 124 mL/min/{1.73_m2} (ref >=60)
GFR, Est Non African American: 107 mL/min/{1.73_m2} (ref >=60)
Glucose, Bld: 105 mg/dL — ABNORMAL HIGH (ref 65–99)
Potassium: 4.5 mmol/L (ref 3.5–5.3)
Sodium: 140 mmol/L (ref 135–146)

## 2020-10-22 LAB — TSH: TSH: 2.59 mIU/L

## 2020-10-26 ENCOUNTER — Other Ambulatory Visit (HOSPITAL_COMMUNITY)
Admission: RE | Admit: 2020-10-26 | Discharge: 2020-10-26 | Disposition: A | Discharge status: Home / Self Care | Payer: PRIVATE HEALTH INSURANCE | Source: Ambulatory Visit | Attending: Family Medicine | Admitting: Family Medicine

## 2020-10-26 DIAGNOSIS — Z124 Encounter for screening for malignant neoplasm of cervix: Secondary | ICD-10-CM | POA: Insufficient documentation

## 2020-10-26 DIAGNOSIS — Z0000 Encounter for general adult medical examination without abnormal findings: Secondary | ICD-10-CM | POA: Diagnosis present

## 2020-10-26 NOTE — Addendum Note (Signed)
Addended by: Lerry Liner on: 10/26/2020 09:24 AM   Modules accepted: Orders

## 2020-10-26 NOTE — Addendum Note (Signed)
Addended by: Lerry Liner on: 10/26/2020 09:25 AM   Modules accepted: Orders

## 2020-10-28 LAB — CYTOLOGY - PAP: Diagnosis: NEGATIVE

## 2020-10-29 NOTE — Progress Notes (Signed)
Normal, repeat in 2 years

## 2020-12-28 ENCOUNTER — Other Ambulatory Visit: Payer: Self-pay | Admitting: Family Medicine

## 2020-12-28 DIAGNOSIS — Z1231 Encounter for screening mammogram for malignant neoplasm of breast: Secondary | ICD-10-CM

## 2020-12-30 ENCOUNTER — Ambulatory Visit
Admission: RE | Admit: 2020-12-30 | Discharge: 2020-12-30 | Disposition: A | Discharge status: Home / Self Care | Payer: PRIVATE HEALTH INSURANCE | Source: Ambulatory Visit | Attending: Family Medicine | Admitting: Family Medicine

## 2020-12-30 ENCOUNTER — Other Ambulatory Visit: Payer: Self-pay

## 2020-12-30 DIAGNOSIS — Z1231 Encounter for screening mammogram for malignant neoplasm of breast: Secondary | ICD-10-CM

## 2020-12-31 ENCOUNTER — Other Ambulatory Visit: Payer: Self-pay | Admitting: Family Medicine

## 2020-12-31 DIAGNOSIS — R928 Other abnormal and inconclusive findings on diagnostic imaging of breast: Secondary | ICD-10-CM

## 2021-01-15 ENCOUNTER — Other Ambulatory Visit: Payer: Self-pay

## 2021-01-15 ENCOUNTER — Ambulatory Visit: Payer: PRIVATE HEALTH INSURANCE

## 2021-01-15 ENCOUNTER — Ambulatory Visit
Admission: RE | Admit: 2021-01-15 | Discharge: 2021-01-15 | Disposition: A | Discharge status: Home / Self Care | Payer: PRIVATE HEALTH INSURANCE | Source: Ambulatory Visit | Attending: Family Medicine | Admitting: Family Medicine

## 2021-01-15 DIAGNOSIS — R928 Other abnormal and inconclusive findings on diagnostic imaging of breast: Secondary | ICD-10-CM

## 2021-03-31 ENCOUNTER — Telehealth: Payer: PRIVATE HEALTH INSURANCE | Admitting: Nurse Practitioner

## 2021-03-31 DIAGNOSIS — N3000 Acute cystitis without hematuria: Secondary | ICD-10-CM | POA: Diagnosis not present

## 2021-03-31 MED ORDER — SULFAMETHOXAZOLE-TRIMETHOPRIM 800-160 MG PO TABS: 1.0000 | ORAL_TABLET | Freq: Two times a day (BID) | ORAL | 0 refills | Status: DC

## 2021-03-31 NOTE — Progress Notes (Signed)

## 2021-09-01 ENCOUNTER — Other Ambulatory Visit: Payer: Self-pay | Admitting: Family Medicine

## 2021-09-27 ENCOUNTER — Other Ambulatory Visit: Payer: Self-pay | Admitting: Family Medicine

## 2021-10-20 ENCOUNTER — Other Ambulatory Visit: Payer: Self-pay | Admitting: Family Medicine

## 2021-10-20 NOTE — Telephone Encounter (Signed)
Pt needs appointment  For further refills 

## 2021-10-31 ENCOUNTER — Other Ambulatory Visit: Payer: Self-pay | Admitting: Family Medicine

## 2021-11-18 ENCOUNTER — Other Ambulatory Visit: Payer: Self-pay | Admitting: Family Medicine

## 2021-12-29 ENCOUNTER — Other Ambulatory Visit: Payer: Self-pay | Admitting: Family Medicine

## 2021-12-31 ENCOUNTER — Other Ambulatory Visit: Payer: Self-pay | Admitting: Family Medicine

## 2021-12-31 DIAGNOSIS — Z1231 Encounter for screening mammogram for malignant neoplasm of breast: Secondary | ICD-10-CM

## 2022-01-05 ENCOUNTER — Ambulatory Visit
Admission: RE | Admit: 2022-01-05 | Discharge: 2022-01-05 | Disposition: A | Discharge status: Home / Self Care | Payer: PRIVATE HEALTH INSURANCE | Source: Ambulatory Visit | Attending: Family Medicine | Admitting: Family Medicine

## 2022-01-05 DIAGNOSIS — Z1231 Encounter for screening mammogram for malignant neoplasm of breast: Secondary | ICD-10-CM

## 2022-01-13 ENCOUNTER — Encounter: Payer: Self-pay | Admitting: Family Medicine

## 2022-01-13 ENCOUNTER — Ambulatory Visit (INDEPENDENT_AMBULATORY_CARE_PROVIDER_SITE_OTHER): Payer: PRIVATE HEALTH INSURANCE | Admitting: Family Medicine

## 2022-01-13 VITALS — BP 108/74 | HR 75 | Temp 98.7°F | Ht 67.0 in | Wt 195.5 lb

## 2022-01-13 DIAGNOSIS — J3089 Other allergic rhinitis: Secondary | ICD-10-CM

## 2022-01-13 DIAGNOSIS — Z0000 Encounter for general adult medical examination without abnormal findings: Secondary | ICD-10-CM

## 2022-01-13 LAB — CBC WITH DIFFERENTIAL/PLATELET
Basophils Absolute: 0 10*3/uL (ref 0.0–0.1)
Basophils Relative: 0.4 % (ref 0.0–3.0)
Eosinophils Absolute: 0 10*3/uL (ref 0.0–0.7)
Eosinophils Relative: 0.7 % (ref 0.0–5.0)
HCT: 36.6 % (ref 36.0–46.0)
Hemoglobin: 11.8 g/dL — ABNORMAL LOW (ref 12.0–15.0)
Lymphocytes Relative: 25.7 % (ref 12.0–46.0)
Lymphs Abs: 1.1 10*3/uL (ref 0.7–4.0)
MCHC: 32.2 g/dL (ref 30.0–36.0)
MCV: 77.4 fl — ABNORMAL LOW (ref 78.0–100.0)
Monocytes Absolute: 0.2 10*3/uL (ref 0.1–1.0)
Monocytes Relative: 4.7 % (ref 3.0–12.0)
Neutro Abs: 3 10*3/uL (ref 1.4–7.7)
Neutrophils Relative %: 68.5 % (ref 43.0–77.0)
Platelets: 320 10*3/uL (ref 150.0–400.0)
RBC: 4.73 Mil/uL (ref 3.87–5.11)
RDW: 14.3 % (ref 11.5–15.5)
WBC: 4.4 10*3/uL (ref 4.0–10.5)

## 2022-01-13 LAB — LIPID PANEL
Cholesterol: 170 mg/dL (ref 0–200)
HDL: 81.6 mg/dL (ref >39.00)
LDL Cholesterol: 71 mg/dL (ref 0–99)
NonHDL: 88.17
Total CHOL/HDL Ratio: 2
Triglycerides: 85 mg/dL (ref 0.0–149.0)
VLDL: 17 mg/dL (ref 0.0–40.0)

## 2022-01-13 LAB — BASIC METABOLIC PANEL
BUN: 11 mg/dL (ref 6–23)
CO2: 31 mEq/L (ref 19–32)
Calcium: 9 mg/dL (ref 8.4–10.5)
Chloride: 102 mEq/L (ref 96–112)
Creatinine, Ser: 0.74 mg/dL (ref 0.40–1.20)
GFR: 99.19 mL/min (ref >60.00)
Glucose, Bld: 96 mg/dL (ref 70–99)
Potassium: 3.8 mEq/L (ref 3.5–5.1)
Sodium: 136 mEq/L (ref 135–145)

## 2022-01-13 LAB — HEMOGLOBIN A1C: Hgb A1c MFr Bld: 5.5 % (ref 4.6–6.5)

## 2022-01-13 LAB — HEPATIC FUNCTION PANEL
ALT: 17 U/L (ref 0–35)
AST: 19 U/L (ref 0–37)
Albumin: 4.6 g/dL (ref 3.5–5.2)
Alkaline Phosphatase: 88 U/L (ref 39–117)
Bilirubin, Direct: 0.1 mg/dL (ref 0.0–0.3)
Total Bilirubin: 0.4 mg/dL (ref 0.2–1.2)
Total Protein: 7.3 g/dL (ref 6.0–8.3)

## 2022-01-13 LAB — TSH: TSH: 1.81 u[IU]/mL (ref 0.35–5.50)

## 2022-01-13 MED ORDER — MONTELUKAST SODIUM 10 MG PO TABS: 10.0000 mg | ORAL_TABLET | Freq: Every day | ORAL | 3 refills | Status: AC

## 2022-01-13 MED ORDER — BENZONATATE 100 MG PO CAPS: 100.0000 mg | ORAL_CAPSULE | Freq: Three times a day (TID) | ORAL | 5 refills | Status: DC | PRN

## 2022-01-13 MED ORDER — OMEPRAZOLE 40 MG PO CPDR: 40.0000 mg | DELAYED_RELEASE_CAPSULE | Freq: Every day | ORAL | 3 refills | Status: DC

## 2022-01-13 NOTE — Progress Notes (Signed)
° °  Subjective:    Patient ID: Michele Carey, female    DOB: 1975-12-18, 46 y.o.   MRN: [id number]  HPI Here for a well exam. She feels well. She plans to see a GYN soon for a Pap smear and exam. She gets yearly mammograms.    Review of Systems  Constitutional: Negative.   HENT: Negative.    Eyes: Negative.   Respiratory: Negative.    Cardiovascular: Negative.   Gastrointestinal: Negative.   Genitourinary:  Negative for decreased urine volume, difficulty urinating, dyspareunia, dysuria, enuresis, flank pain, frequency, hematuria, pelvic pain and urgency.  Musculoskeletal: Negative.   Skin: Negative.   Neurological: Negative.  Negative for headaches.  Psychiatric/Behavioral: Negative.        Objective:   Physical Exam Constitutional:      General: She is not in acute distress.    Appearance: Normal appearance. She is well-developed.  HENT:     Head: Normocephalic and atraumatic.     Right Ear: External ear normal.     Left Ear: External ear normal.     Nose: Nose normal.     Mouth/Throat:     Pharynx: No oropharyngeal exudate.  Eyes:     General: No scleral icterus.    Conjunctiva/sclera: Conjunctivae normal.     Pupils: Pupils are equal, round, and reactive to light.  Neck:     Thyroid: No thyromegaly.     Vascular: No JVD.  Cardiovascular:     Rate and Rhythm: Normal rate and regular rhythm.     Heart sounds: Normal heart sounds. No murmur heard.   No friction rub. No gallop.  Pulmonary:     Effort: Pulmonary effort is normal. No respiratory distress.     Breath sounds: Normal breath sounds. No wheezing or rales.  Chest:     Chest wall: No tenderness.  Abdominal:     General: Bowel sounds are normal. There is no distension.     Palpations: Abdomen is soft. There is no mass.     Tenderness: There is no abdominal tenderness. There is no guarding or rebound.  Musculoskeletal:        General: No tenderness. Normal range of motion.     Cervical back: Normal range of  motion and neck supple.  Lymphadenopathy:     Cervical: No cervical adenopathy.  Skin:    General: Skin is warm and dry.     Findings: No erythema or rash.  Neurological:     Mental Status: She is alert and oriented to person, place, and time.     Cranial Nerves: No cranial nerve deficit.     Motor: No abnormal muscle tone.     Coordination: Coordination normal.     Deep Tendon Reflexes: Reflexes are normal and symmetric. Reflexes normal.  Psychiatric:        Behavior: Behavior normal.        Thought Content: Thought content normal.        Judgment: Judgment normal.          Assessment & Plan:  Well exam. We discussed diet and exercise. Get fasting labs. Alysia Penna, MD

## 2022-07-24 ENCOUNTER — Encounter: Payer: Self-pay | Admitting: Family Medicine

## 2022-07-25 NOTE — Telephone Encounter (Signed)
Please advise 

## 2022-08-02 NOTE — Telephone Encounter (Signed)
I recommend she wait 6 weeks after the Covid infection before getting the flu shot

## 2022-11-17 ENCOUNTER — Telehealth (INDEPENDENT_AMBULATORY_CARE_PROVIDER_SITE_OTHER): Payer: PRIVATE HEALTH INSURANCE | Admitting: Family Medicine

## 2022-11-17 VITALS — Temp 102.0°F

## 2022-11-17 DIAGNOSIS — R059 Cough, unspecified: Secondary | ICD-10-CM | POA: Diagnosis not present

## 2022-11-17 DIAGNOSIS — R509 Fever, unspecified: Secondary | ICD-10-CM | POA: Diagnosis not present

## 2022-11-17 DIAGNOSIS — J101 Influenza due to other identified influenza virus with other respiratory manifestations: Secondary | ICD-10-CM | POA: Diagnosis not present

## 2022-11-17 DIAGNOSIS — R52 Pain, unspecified: Secondary | ICD-10-CM | POA: Diagnosis not present

## 2022-11-17 LAB — POCT INFLUENZA A/B
Influenza A, POC: POSITIVE — AB
Influenza B, POC: NEGATIVE

## 2022-11-17 LAB — POC COVID19 BINAXNOW: SARS Coronavirus 2 Ag: NEGATIVE

## 2022-11-17 LAB — POCT RAPID STREP A (OFFICE): Rapid Strep A Screen: POSITIVE — AB

## 2022-11-17 MED ORDER — OSELTAMIVIR PHOSPHATE 75 MG PO CAPS: 75.0000 mg | ORAL_CAPSULE | Freq: Two times a day (BID) | ORAL | 0 refills | Status: AC

## 2022-11-17 NOTE — Progress Notes (Signed)
Established Patient Office Visit  Subjective   Patient ID: Natasia Sanko, female    DOB: 02-26-1976  Age: 46 y.o. MRN: [id number]  Chief Complaint  Patient presents with   Fever    Temperature of 102 degrees x1 day   Cough    Non-productive x2 days, tried Delsym with no relief and Mucinex with some relief   Generalized Body Aches    Noted since this morning    Patient is reporting 2 day history of fever, coughing, body aches, nasal congestion, testing reveals Flu A positive status and also strep screen is positive as well. Patient states she is not having any sore throat or difficulty swallowing, it could be a false positive at this time.  Fever  This is a new problem. Associated symptoms include congestion, coughing and muscle aches. Pertinent negatives include no sore throat.  Cough Associated symptoms include a fever. Pertinent negatives include no sore throat.      Review of Systems  Constitutional:  Positive for fever.  HENT:  Positive for congestion. Negative for sore throat.   Respiratory:  Positive for cough.   All other systems reviewed and are negative.     Objective:     Temp (!) 102 F (38.9 C)   LMP 10/28/2022 (Exact Date)    Physical Exam Vitals reviewed.  Constitutional:      Appearance: Normal appearance. She is normal weight.  Eyes:     Conjunctiva/sclera: Conjunctivae normal.  Pulmonary:     Effort: Pulmonary effort is normal.  Neurological:     Mental Status: She is alert and oriented to person, place, and time. Mental status is at baseline.  Psychiatric:        Mood and Affect: Mood normal.        Behavior: Behavior normal.      Results for orders placed or performed in visit on 11/17/22  POC COVID-19  Result Value Ref Range   SARS Coronavirus 2 Ag Negative Negative  POC Influenza A/B  Result Value Ref Range   Influenza A, POC Positive (A) Negative   Influenza B, POC Negative Negative  POC Rapid Strep A  Result Value Ref Range    Rapid Strep A Screen Positive (A) Negative      The 10-year ASCVD risk score (Arnett DK, et al., 2019) is: 0.2%    Assessment & Plan:   Problem List Items Addressed This Visit   None Visit Diagnoses     Fever, unspecified fever cause    -  Primary   Relevant Orders   POC COVID-19 (Completed)   POC Influenza A/B (Completed)   POC Rapid Strep A (Completed)   Cough, unspecified type       Relevant Orders   POC COVID-19 (Completed)   POC Influenza A/B (Completed)   POC Rapid Strep A (Completed)   Generalized body aches       Relevant Orders   POC COVID-19 (Completed)   POC Influenza A/B (Completed)   POC Rapid Strep A (Completed)   Influenza A       Relevant Medications   oseltamivir (TAMIFLU) 75 MG capsule     Patient is positive for Flu and she is within the window to receive Tamiflu, 75 g BID for 5 days. Recommended she continue to mask while coughing, recommended cough suppressants OTC and use of tylenol or motrin OTC for fever control. I do not think she needs abx for the strep positive because she does not have sore  throat and it may be a false positive. Pt instructed that if she is not better after the tamiflu to call us and we will call in abx at that time.  No follow-ups on file.    Karie Georges, MD

## 2023-01-01 ENCOUNTER — Other Ambulatory Visit: Payer: Self-pay | Admitting: Family Medicine

## 2023-02-16 ENCOUNTER — Other Ambulatory Visit: Payer: Self-pay | Admitting: Family Medicine

## 2023-02-16 DIAGNOSIS — Z1231 Encounter for screening mammogram for malignant neoplasm of breast: Secondary | ICD-10-CM

## 2023-03-31 ENCOUNTER — Ambulatory Visit
Admission: RE | Admit: 2023-03-31 | Discharge: 2023-03-31 | Disposition: A | Discharge status: Home / Self Care | Payer: PRIVATE HEALTH INSURANCE | Source: Ambulatory Visit | Attending: Family Medicine | Admitting: Family Medicine

## 2023-03-31 DIAGNOSIS — Z1231 Encounter for screening mammogram for malignant neoplasm of breast: Secondary | ICD-10-CM

## 2023-11-06 ENCOUNTER — Encounter: Payer: Self-pay | Admitting: Family Medicine

## 2023-11-07 NOTE — Telephone Encounter (Signed)
Pt vaccinations COVID /Flu updated

## 2024-01-19 IMAGING — MG MM DIGITAL SCREENING BILAT W/ TOMO AND CAD
8 series · 8 of 24 positions shown · non-contrast
Comparison: Previous exam(s).

CLINICAL DATA: Screening.

EXAM:
DIGITAL SCREENING BILATERAL MAMMOGRAM WITH TOMOSYNTHESIS AND CAD
TECHNIQUE: Bilateral screening digital craniocaudal and mediolateral oblique
mammograms were obtained. Bilateral screening digital breast
tomosynthesis was performed. The images were evaluated with
computer-aided detection.

[L CC synth-2D]
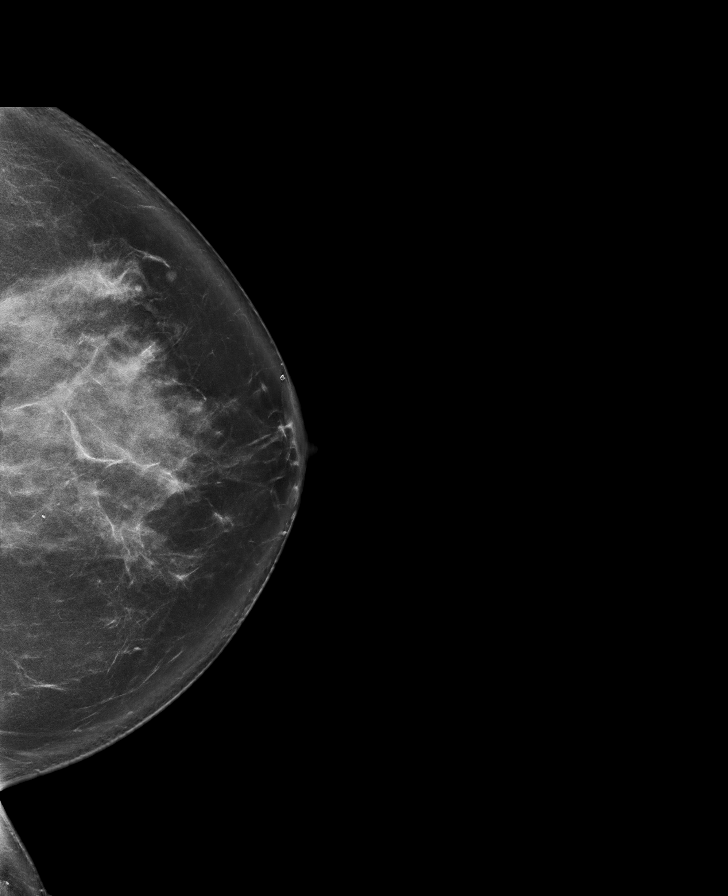

[R CC synth-2D]
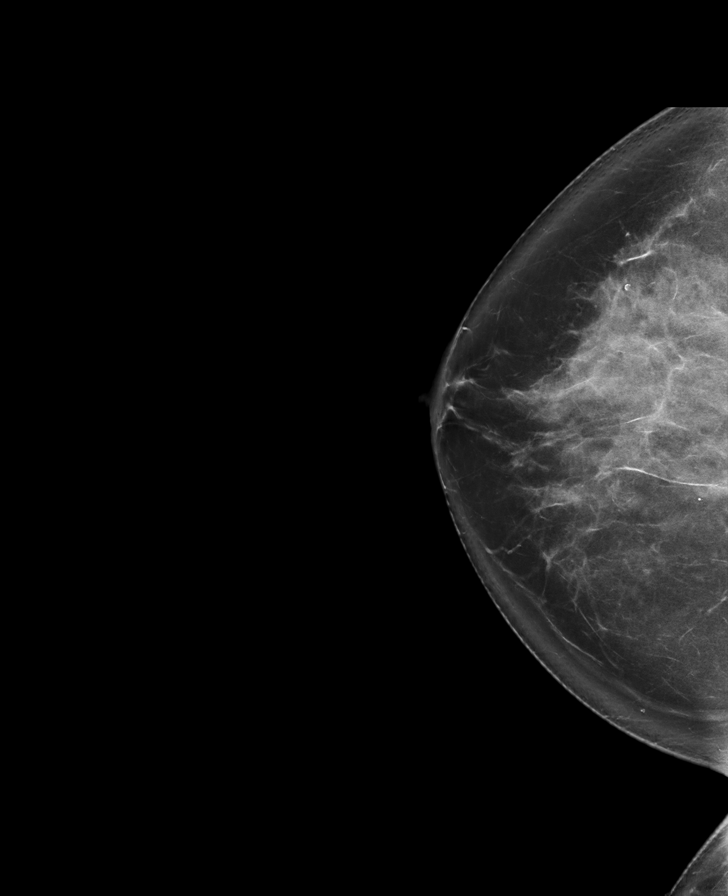

[R MLO synth-2D]
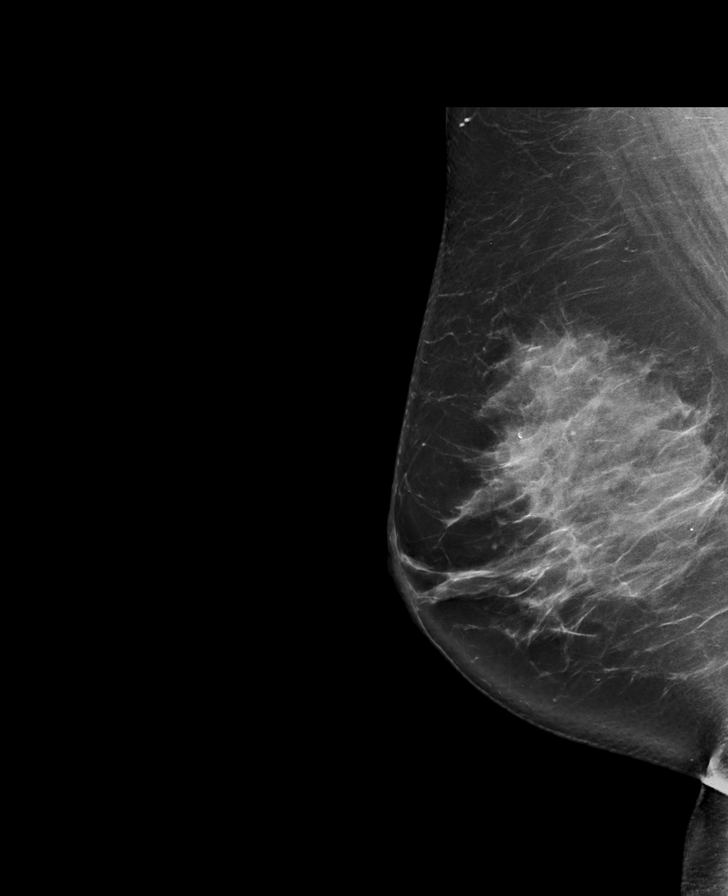

[L MLO synth-2D]
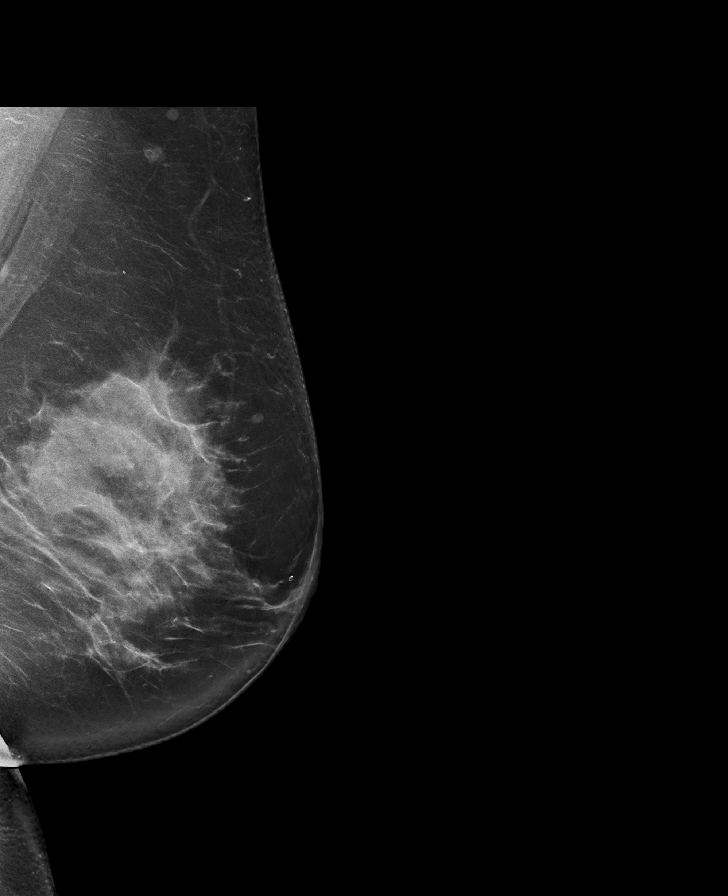

[L CC tomo · tomo slice 45/90.0]
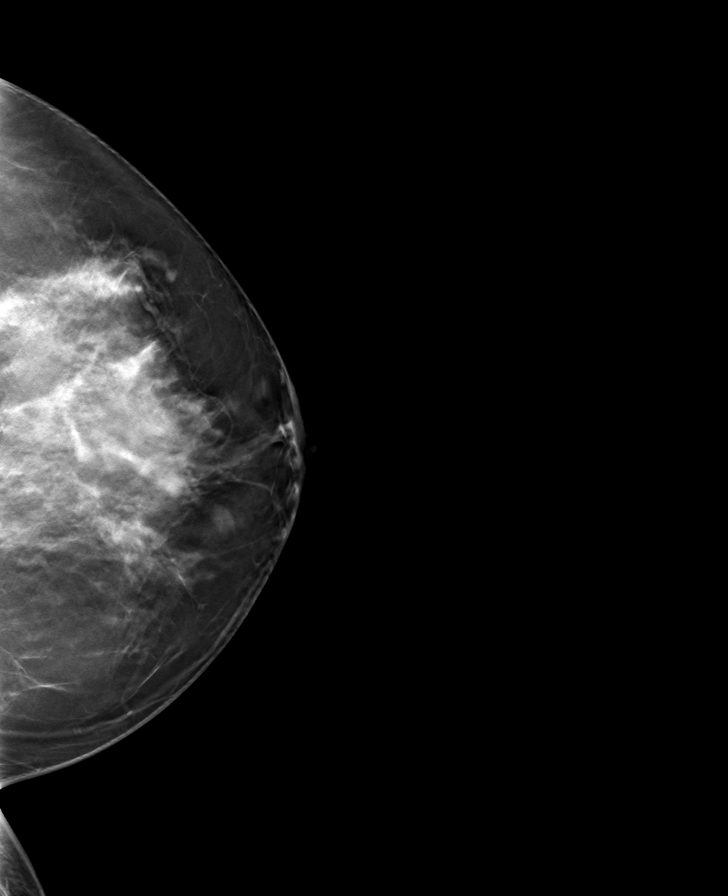

[R MLO tomo · tomo slice 50/99.0]
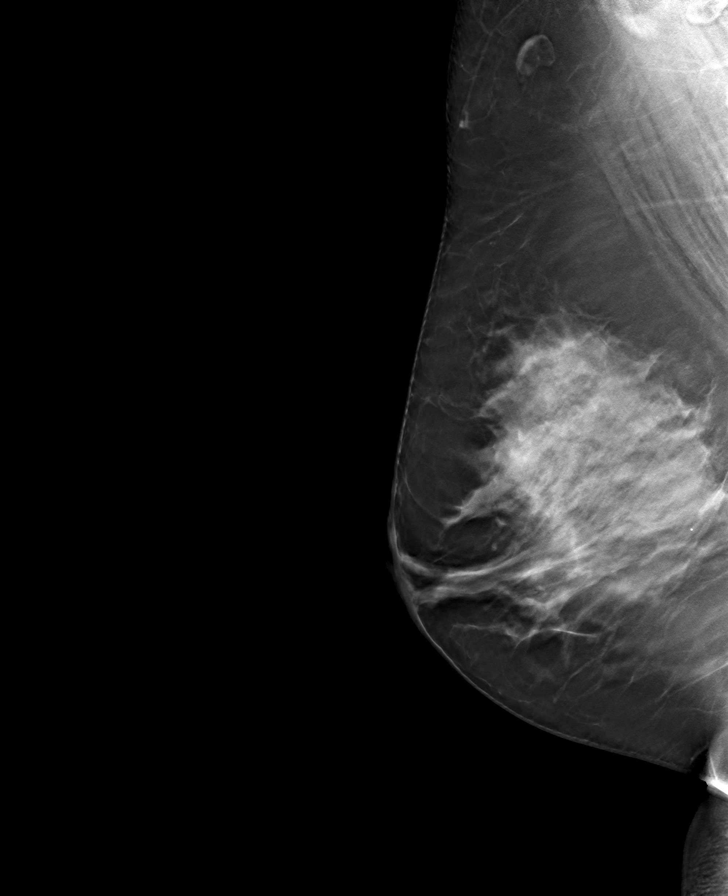

[R CC tomo · tomo slice 47/92.0]
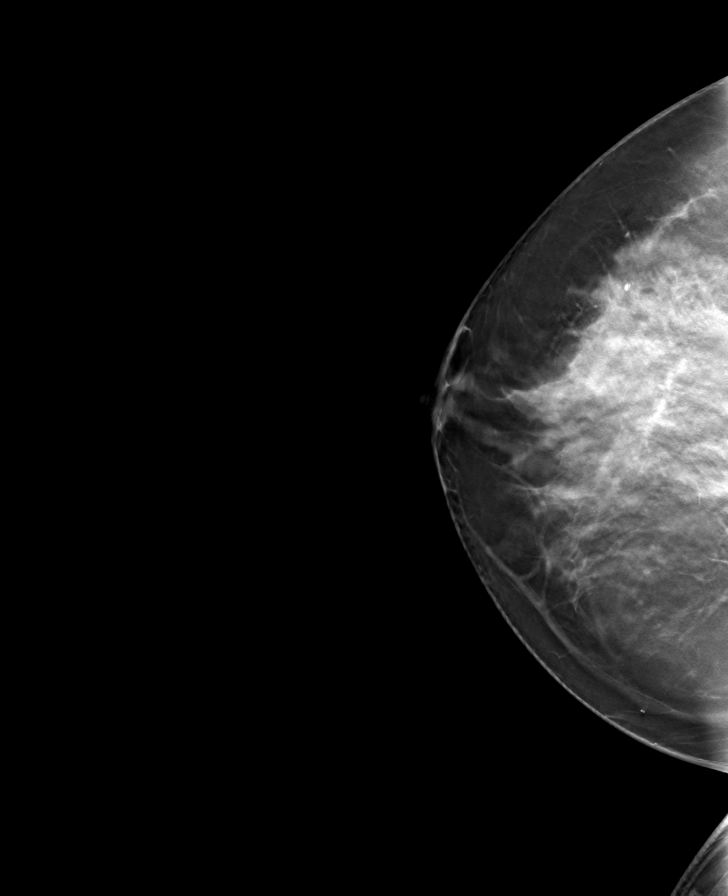

[L MLO tomo · tomo slice 51/102.0]
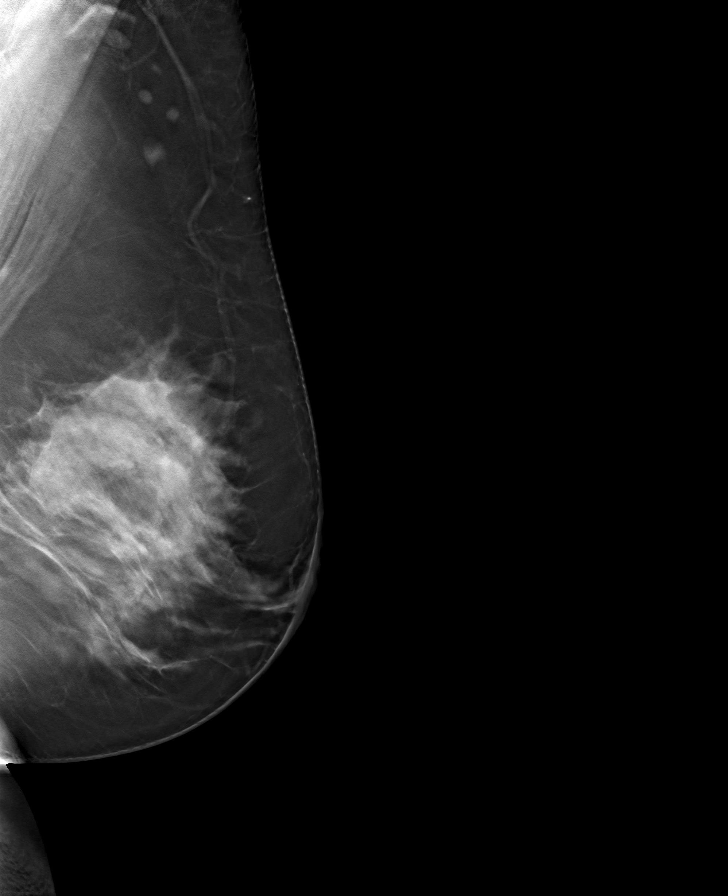

[8 of 24 positions shown; findings below may reference images not displayed]

ACR Breast Density Category d: The breast tissue is extremely dense,
which lowers the sensitivity of mammography
FINDINGS: There are no findings suspicious for malignancy.
IMPRESSION: No mammographic evidence of malignancy. A result letter of this
screening mammogram will be mailed directly to the patient.

RECOMMENDATION:
Screening mammogram in one year. (Code:TA-V-WV9)

BI-RADS CATEGORY  1: Negative.

## 2024-08-26 ENCOUNTER — Ambulatory Visit: Payer: Self-pay

## 2024-08-26 NOTE — Telephone Encounter (Signed)
     FYI Only or Action Required?: FYI only for provider.  Patient was last seen in primary care on 11/17/2022 by Ozell Heron HERO, MD.  Called Nurse Triage reporting Wrist Injury and Wrist Pain.  Symptoms began yesterday.  Interventions attempted: Ice/heat application.  Symptoms are: right wrist injury when lifting heavy bag of dog food (heard a aloud pop), right wrist pain with redness and swelling stable.  Triage Disposition: See Physician Within 24 Hours  Patient/caregiver understands and will follow disposition?: Yes             Copied from CRM #[id number]. Topic: Clinical - Red Word Triage >> Aug 26, 2024  8:21 AM Timindy P wrote: Red Word that prompted transfer to Nurse Triage: Wrist swelling after injury that occurred yesterday. Patient stating she has full range of motion in wrist but it causes pain. Pt heard a loud pop I her wrist while lifting a bag of dog food at [employer] yesterday. Reason for Disposition  Can't move injured wrist normally (bend or straighten completely)  Answer Assessment - Initial Assessment Questions 1. MECHANISM: How did the injury happen?     Right wrist injury. She was at [facility] yesterday and she picked up a heavy bad of dog food. She heard a loud pop.  2. ONSET: When did the injury happen? (e.g., minutes or hours ago)      Yesterday, around 10:30-11am.  3. APPEARANCE of INJURY: What does the injury look like?      Slight swelling and redness. No bruising.  4. SEVERITY: Can you use your wrist normally? Can you move your wrist back and forth? Can you hold something in your hand?     Pain on side of wrist near pinky finger, more concentrated on wrist. She states it was painful to lift her coffee mug. She states it is painful flexing her hand/wrist.  5. SIZE: For cuts, bruises, or swelling, ask: How large is it? (e.g., inches or centimeters; entire wrist)      Mild swelling in right wrist with slight pinkness.  6. PAIN: How  bad is the pain? (Scale 0-10; or none, mild, moderate, severe)     She states the pain is more mild at rest and moderate when using the wrist/hand.  7. TETANUS: For any breaks in the skin, ask: When was your last tetanus booster?     N/A.  8. OTHER SYMPTOMS: Do you have any other symptoms?      No.  9. PREGNANCY: Is there any chance you are pregnant? When was your last menstrual period?     LMP: 2 weeks ago.  Protocols used: Wrist Injury-A-AH

## 2024-08-26 NOTE — Telephone Encounter (Signed)
 Noted

## 2024-08-27 ENCOUNTER — Ambulatory Visit: Payer: Self-pay | Admitting: Family Medicine

## 2024-08-27 ENCOUNTER — Encounter: Payer: Self-pay | Admitting: Family Medicine

## 2024-08-27 ENCOUNTER — Ambulatory Visit: Payer: PRIVATE HEALTH INSURANCE | Admitting: Family Medicine

## 2024-08-27 VITALS — BP 128/80 | HR 75 | Temp 99.2°F | Resp 16 | Ht 67.0 in | Wt 205.2 lb

## 2024-08-27 DIAGNOSIS — S63501A Unspecified sprain of right wrist, initial encounter: Secondary | ICD-10-CM

## 2024-08-27 DIAGNOSIS — Z13220 Encounter for screening for lipoid disorders: Secondary | ICD-10-CM

## 2024-08-27 DIAGNOSIS — Z114 Encounter for screening for human immunodeficiency virus [HIV]: Secondary | ICD-10-CM

## 2024-08-27 DIAGNOSIS — Z131 Encounter for screening for diabetes mellitus: Secondary | ICD-10-CM

## 2024-08-27 DIAGNOSIS — Z1211 Encounter for screening for malignant neoplasm of colon: Secondary | ICD-10-CM

## 2024-08-27 DIAGNOSIS — Z1159 Encounter for screening for other viral diseases: Secondary | ICD-10-CM

## 2024-08-27 LAB — BASIC METABOLIC PANEL WITH GFR
BUN: 10 mg/dL (ref 6–23)
CO2: 26 meq/L (ref 19–32)
Calcium: 9.1 mg/dL (ref 8.4–10.5)
Chloride: 104 meq/L (ref 96–112)
Creatinine, Ser: 0.6 mg/dL (ref 0.40–1.20)
GFR: 108.03 mL/min (ref >60.00)
Glucose, Bld: 101 mg/dL — ABNORMAL HIGH (ref 70–99)
Potassium: 4.6 meq/L (ref 3.5–5.1)
Sodium: 138 meq/L (ref 135–145)

## 2024-08-27 LAB — LIPID PANEL
Cholesterol: 161 mg/dL (ref 0–200)
HDL: 58.9 mg/dL (ref >39.00)
LDL Cholesterol: 78 mg/dL (ref 0–99)
NonHDL: 102.13
Total CHOL/HDL Ratio: 3
Triglycerides: 120 mg/dL (ref 0.0–149.0)
VLDL: 24 mg/dL (ref 0.0–40.0)

## 2024-08-27 MED ORDER — CELECOXIB 100 MG PO CAPS: 100.0000 mg | ORAL_CAPSULE | Freq: Two times a day (BID) | ORAL | 0 refills | Status: AC

## 2024-08-27 NOTE — Progress Notes (Signed)
 ACUTE VISIT Chief Complaint  Patient presents with   Wrist Injury    Patient complains of right wrist pain x1 day after lifting heavy bag of dog food, applied brace    Discussed the use of AI scribe software for clinical note transcription with the patient, who gave verbal consent to proceed.  History of Present Illness Michele Carey is a 48 year old right-handed female who presents with right wrist pain after an injury as described above.  She experienced right wrist pain that began on Sunday after lifting a large bag of dog food, accompanied by an audible 'pop' and immediate pain. The pain is primarily located on the ulnar side of the wrist, with occasional dorsal radiation.  There is minor swelling in the wrist without bruising. Initially, the pain was significant enough to prevent her from holding a coffee cup or lifting a full Brita pitcher. She rated the pain as a 4 out of 10 yesterday, but it has improved significantly today.  She has not taken any medication for the pain but has been using ice and heat for relief. A wrist brace is being worn to help with pain.  No cough, chest pain, SOB, numbness,tingling, or weakness.   She has not had a screening labs in some time, overdue for hepatitis C screening and she would like lipid and diabetes screening done. She has not been consistent with following a healthful diet, she acknowledges that she overeats when she is under stress. She walks her dog daily. No history of hyperlipidemia or diabetes. She is also due for colon cancer screening.  Review of Systems  Constitutional:  Negative for appetite change, chills and fever.  HENT:  Negative for sore throat.   Gastrointestinal:  Negative for abdominal pain and nausea.  Endocrine: Negative for polydipsia, polyphagia and polyuria.  Skin:  Negative for rash and wound.  Neurological:  Negative for weakness and headaches.  See other pertinent positives and negatives in HPI.  Current  Outpatient Medications on File Prior to Visit  Medication Sig Dispense Refill   albuterol  (VENTOLIN  HFA) 108 (90 Base) MCG/ACT inhaler Inhale 2 puffs into the lungs every 4 (four) hours as needed for wheezing or shortness of breath. 18 g 11   azelastine (OPTIVAR) 0.05 % ophthalmic solution      cetirizine  (ZYRTEC ) 10 MG tablet Take 1 tablet (10 mg total) by mouth daily. 30 tablet 3   fluticasone  (FLONASE ) 50 MCG/ACT nasal spray SPRAY 2 SPRAYS INTO EACH NOSTRIL EVERY DAY 16 mL 0   levocetirizine (XYZAL) 5 MG tablet TAKE 1 TABLET BY MOUTH EVERY DAY IN THE EVENING FOR 30 DAYS     montelukast  (SINGULAIR ) 10 MG tablet Take 1 tablet (10 mg total) by mouth at bedtime. 90 tablet 3   omeprazole  (PRILOSEC) 20 MG capsule Take 20 mg by mouth daily.     No current facility-administered medications on file prior to visit.   Past Medical History:  Diagnosis Date   Acne    sees [employer] [facility]   Allergy    sees Dr. Frutoso    Asthma    GERD (gastroesophageal reflux disease)    Psoriasis    Allergies  Allergen Reactions   Cefdinir     REACTION: diarrhea    Social History   Socioeconomic History   Marital status: Single    Spouse name: Not on file   Number of children: Not on file   Years of education: Not on file  Highest education level: Bachelor's degree (e.g., BA, AB, BS)  Occupational History   Not on file  Tobacco Use   Smoking status: Former   Smokeless tobacco: Never  Vaping Use   Vaping status: Never Used  Substance and Sexual Activity   Alcohol use: Yes    Alcohol/week: 0.0 standard drinks of alcohol    Comment: occ   Drug use: No   Sexual activity: Yes  Other Topics Concern   Not on file  Social History Narrative   Not on file   Social Drivers of Health   Financial Resource Strain: Low Risk  (11/17/2022)   Overall Financial Resource Strain (CARDIA)    Difficulty of Paying Living Expenses: Not very hard  Food Insecurity: No Food Insecurity (11/17/2022)   Hunger  Vital Sign    Worried About Running Out of Food in the Last Year: Never true    Ran Out of Food in the Last Year: Never true  Transportation Needs: No Transportation Needs (11/17/2022)   PRAPARE - Administrator, Civil Service (Medical): No    Lack of Transportation (Non-Medical): No  Physical Activity: Sufficiently Active (11/17/2022)   Exercise Vital Sign    Days of Exercise per Week: 7 days    Minutes of Exercise per Session: 60 min  Stress: Stress Concern Present (11/17/2022)   [employer] of Occupational Health - Occupational Stress Questionnaire    Feeling of Stress : To some extent  Social Connections: Socially Isolated (11/17/2022)   Social Connection and Isolation Panel    Frequency of Communication with Friends and Family: Once a week    Frequency of Social Gatherings with Friends and Family: Once a week    Attends Religious Services: Never    Database administrator or Organizations: No    Attends Engineer, structural: Not on file    Marital Status: Never married   Vitals:   08/27/24 0910  BP: 128/80  Pulse: 75  Resp: 16  Temp: 99.2 F (37.3 C)  SpO2: 99%   Body mass index is 32.14 kg/m.  Physical Exam Vitals and nursing note reviewed.  Constitutional:      General: She is not in acute distress.    Appearance: She is well-developed.  HENT:     Head: Normocephalic and atraumatic.  Eyes:     Conjunctiva/sclera: Conjunctivae normal.  Cardiovascular:     Rate and Rhythm: Normal rate and regular rhythm.  Pulmonary:     Effort: Pulmonary effort is normal. No respiratory distress.  Musculoskeletal:     Right wrist: Swelling and tenderness present. No deformity, effusion, bony tenderness, snuff box tenderness or crepitus. Normal range of motion. Normal pulse.     Comments: Right wrist: Pain upon palpation around ulnar styloid process and minimal edema.  There is no bone tenderness with vibration. Pain elicited with dorsal flexion and  supination.   Skin:    General: Skin is warm.     Findings: Rash present.  Neurological:     General: No focal deficit present.     Mental Status: She is alert and oriented to person, place, and time.     Gait: Gait normal.  Psychiatric:        Mood and Affect: Mood and affect normal.    ASSESSMENT AND PLAN: Ms. Yau was seen today for right wrist pain. Lab Results  Component Value Date   NA 138 08/27/2024   CL 104 08/27/2024   K 4.6 08/27/2024  CO2 26 08/27/2024   BUN 10 08/27/2024   CREATININE 0.60 08/27/2024   GFR 108.03 08/27/2024   CALCIUM 9.1 08/27/2024   ALBUMIN 4.6 01/13/2022   GLUCOSE 101 (H) 08/27/2024   Lab Results  Component Value Date   CHOL 161 08/27/2024   HDL 58.90 08/27/2024   LDLCALC 78 08/27/2024   TRIG 120.0 08/27/2024   CHOLHDL 3 08/27/2024   Right wrist sprain, initial encounter We discussed differential diagnosis, history and examination today do not suggest a fracture.  We do not have x-ray service today, we discussed options, she agrees with holding on imaging for now but if by Friday pain is not really improved, x-ray can be done same day. Localized a few times throughout the day for 72 hours. Celebrex 100 mg twice daily for 10 days. ROM exercises. Avoid trauma. Follow-up as needed.  -     Celecoxib; Take 1 capsule (100 mg total) by mouth 2 (two) times daily for 10 days.  Dispense: 20 capsule; Refill: 0  Colon cancer screening -     Ambulatory referral to Gastroenterology  Encounter for HCV screening test for low risk patient -     Hepatitis C antibody; Future  Encounter for screening for HIV -     HIV Antibody (routine testing w rflx); Future  Lipid screening -     Lipid panel; Future  Diabetes mellitus screening -     Basic metabolic panel with GFR; Future  Health maintenance: She would like lipid and diabetes screening, last labs done in 2023. We discussed option for colon cancer screening, agrees with colonoscopy, GI  referral placed.  I personally spent a total of 30 minutes in the care of the patient today including preparing to see the patient, getting/reviewing separately obtained history, performing a medically appropriate exam/evaluation, counseling and educating, placing orders, documenting clinical information in the EHR, and communicating results.  Return if symptoms worsen or fail to improve.  Bhumi Godbey G. [place], MD  [facility]. [place] office.

## 2024-08-27 NOTE — Patient Instructions (Addendum)
 A few things to remember from today's visit:  Colon cancer screening - Plan: Ambulatory referral to Gastroenterology  Right wrist sprain, initial encounter - Plan: celecoxib (CELEBREX) 100 MG capsule  Encounter for HCV screening test for low risk patient - Plan: Hep C Antibody  Encounter for screening for HIV - Plan: HIV antibody (with reflex)  Local ice for 72 hours. Range of motion exercise. If not greatly improve by Friday we can have X ray done. Celebrex for 10 days.  Do not use My Chart to request refills or for acute issues that need immediate attention. If you send a my chart message, it may take a few days to be addressed, specially if I am not in the office.  Please be sure medication list is accurate. If a new problem present, please set up appointment sooner than planned today.

## 2024-08-28 LAB — HIV ANTIBODY (ROUTINE TESTING W REFLEX)
HIV 1&2 Ab, 4th Generation: NONREACTIVE
HIV FINAL INTERPRETATION: NEGATIVE

## 2024-08-28 LAB — HEPATITIS C ANTIBODY: Hepatitis C Ab: NONREACTIVE
